# Patient Record
Sex: Female | Born: 1945 | Race: White | Hispanic: No | Marital: Married | Smoking: Never smoker
Health system: Southern US, Community
[De-identification: ages and names within clinical notes are randomized; demographics above are authoritative.]

## PROBLEM LIST (undated history)

## (undated) DIAGNOSIS — Z7982 Long term (current) use of aspirin: Secondary | ICD-10-CM

## (undated) DIAGNOSIS — N183 Chronic kidney disease, stage 3 unspecified: Secondary | ICD-10-CM

## (undated) DIAGNOSIS — I6523 Occlusion and stenosis of bilateral carotid arteries: Secondary | ICD-10-CM

## (undated) DIAGNOSIS — E039 Hypothyroidism, unspecified: Secondary | ICD-10-CM

## (undated) DIAGNOSIS — I4510 Unspecified right bundle-branch block: Secondary | ICD-10-CM

## (undated) DIAGNOSIS — M51379 Other intervertebral disc degeneration, lumbosacral region without mention of lumbar back pain or lower extremity pain: Secondary | ICD-10-CM

## (undated) DIAGNOSIS — M1711 Unilateral primary osteoarthritis, right knee: Secondary | ICD-10-CM

## (undated) DIAGNOSIS — N189 Chronic kidney disease, unspecified: Secondary | ICD-10-CM

## (undated) DIAGNOSIS — E119 Type 2 diabetes mellitus without complications: Secondary | ICD-10-CM

## (undated) DIAGNOSIS — I10 Essential (primary) hypertension: Secondary | ICD-10-CM

## (undated) DIAGNOSIS — I350 Nonrheumatic aortic (valve) stenosis: Secondary | ICD-10-CM

## (undated) DIAGNOSIS — K219 Gastro-esophageal reflux disease without esophagitis: Secondary | ICD-10-CM

## (undated) DIAGNOSIS — I5189 Other ill-defined heart diseases: Secondary | ICD-10-CM

## (undated) DIAGNOSIS — I2510 Atherosclerotic heart disease of native coronary artery without angina pectoris: Secondary | ICD-10-CM

## (undated) DIAGNOSIS — E7800 Pure hypercholesterolemia, unspecified: Secondary | ICD-10-CM

## (undated) DIAGNOSIS — M1990 Unspecified osteoarthritis, unspecified site: Secondary | ICD-10-CM

## (undated) HISTORY — PX: CATARACT EXTRACTION: SUR2

## (undated) HISTORY — PX: KNEE SURGERY: SHX244

## (undated) HISTORY — PX: ABDOMINAL HYSTERECTOMY: SHX81

---

## 2008-09-03 ENCOUNTER — Ambulatory Visit: Payer: Self-pay | Admitting: Family Medicine

## 2008-10-01 ENCOUNTER — Ambulatory Visit: Payer: Self-pay | Admitting: Family Medicine

## 2008-11-17 ENCOUNTER — Ambulatory Visit: Payer: Self-pay | Admitting: Family Medicine

## 2009-11-23 ENCOUNTER — Ambulatory Visit: Payer: Self-pay | Admitting: Family Medicine

## 2010-09-17 DIAGNOSIS — I10 Essential (primary) hypertension: Secondary | ICD-10-CM | POA: Insufficient documentation

## 2010-09-17 DIAGNOSIS — R011 Cardiac murmur, unspecified: Secondary | ICD-10-CM

## 2010-09-17 DIAGNOSIS — E039 Hypothyroidism, unspecified: Secondary | ICD-10-CM | POA: Insufficient documentation

## 2010-09-17 DIAGNOSIS — E7800 Pure hypercholesterolemia, unspecified: Secondary | ICD-10-CM | POA: Insufficient documentation

## 2010-09-17 HISTORY — DX: Cardiac murmur, unspecified: R01.1

## 2010-12-01 ENCOUNTER — Ambulatory Visit: Payer: Self-pay | Admitting: Family Medicine

## 2011-03-16 DIAGNOSIS — M1711 Unilateral primary osteoarthritis, right knee: Secondary | ICD-10-CM | POA: Insufficient documentation

## 2011-04-11 ENCOUNTER — Ambulatory Visit: Payer: Self-pay | Admitting: Sports Medicine

## 2011-04-20 ENCOUNTER — Ambulatory Visit: Payer: Self-pay | Admitting: Anesthesiology

## 2011-04-21 ENCOUNTER — Ambulatory Visit: Payer: Self-pay | Admitting: Orthopedic Surgery

## 2011-12-06 ENCOUNTER — Ambulatory Visit: Payer: Self-pay | Admitting: Family Medicine

## 2012-12-06 ENCOUNTER — Ambulatory Visit: Payer: Self-pay | Admitting: Family Medicine

## 2013-01-31 DIAGNOSIS — Z9841 Cataract extraction status, right eye: Secondary | ICD-10-CM

## 2013-01-31 HISTORY — DX: Cataract extraction status, right eye: Z98.41

## 2013-10-11 DIAGNOSIS — I6523 Occlusion and stenosis of bilateral carotid arteries: Secondary | ICD-10-CM | POA: Insufficient documentation

## 2013-12-17 ENCOUNTER — Ambulatory Visit: Payer: Self-pay | Admitting: Family Medicine

## 2014-05-25 NOTE — Op Note (Signed)
PATIENT NAME:  Julie Torres, Julie Torres A MR#:  [id number] DATE OF BIRTH:  07-Nov-1945  DATE OF PROCEDURE:  04/21/2011  PREOPERATIVE DIAGNOSIS: Medial meniscus tear and osteoarthritis, right knee.   POSTOPERATIVE DIAGNOSIS: Medial meniscus tear and osteoarthritis, right knee with loose body.   PROCEDURES:  1. Right knee arthroscopy.  2. Partial medial meniscectomy.  3. Removal of loose body.   SURGEON: Laurene Footman, MD  ANESTHESIA: General.    DESCRIPTION OF PROCEDURE: Patient brought to the Operating Room and after adequate anesthesia was obtained, the right leg was prepped and draped in the usual sterile fashion with a tourniquet and arthroscopic legholder applied. After appropriate patient identification and timeout procedures were completed an inferolateral portal was made and the arthroscope was introduced. Initial inspection revealed significant patellofemoral degenerative change but without exposed bone. Patella tracked well. There was a little bit of a plica shelf superiorly in the suprapatellar pouch which was divided but not really resected and it was an incidental finding. Coming around medially, an inferomedial portal was made and on probing the medial meniscus was found to have a very large tear posteriorly with essentially old buckethandle tear separated in the midportion with the posterior flap towards the notch and more medially. There was extensive osteoarthritis present within the medial compartment with fissuring and some extensive partial thickness loss throughout and this is present through most of the medial compartment both tibia and femur with really no normal articular cartilage. The notch was examined and there was a loose body present within it, approximately a centimeter in diameter. This was removed with use of a grabber and picture taken at the close of the case. Going to the lateral compartment, the lateral compartment was essentially normal with intact meniscus and articular  cartilage. The gutters were checked and there were no loose bodies. After addressing the meniscal pathology in the medial side with the arthroscopic shaver and ArthroCare wand back to a stable margin the knee was thoroughly irrigated and again pre- and postprocedure pictures obtained. The knee was infiltrated with 20 mL 0.5% Sensorcaine with epinephrine. Xeroform, 4 x 4's, Webril, and Ace wrap applied. Patient sent to recovery in stable condition.   ESTIMATED BLOOD LOSS: Minimal.   COMPLICATIONS: None.   SPECIMENS: None.  ____________________________ Laurene Footman, MD mjm:cms D: 04/21/2011 19:29:37 ET T: 04/22/2011 08:22:52 ET JOB#: [id number]  cc: Laurene Footman, MD, <Dictator> Laurene Footman MD ELECTRONICALLY SIGNED 04/22/2011 16:54

## 2014-10-28 ENCOUNTER — Other Ambulatory Visit: Payer: Self-pay | Admitting: Family Medicine

## 2014-10-28 DIAGNOSIS — Z1231 Encounter for screening mammogram for malignant neoplasm of breast: Secondary | ICD-10-CM

## 2014-12-23 ENCOUNTER — Ambulatory Visit: Payer: Self-pay

## 2014-12-24 ENCOUNTER — Ambulatory Visit
Admission: RE | Admit: 2014-12-24 | Discharge: 2014-12-24 | Disposition: A | Discharge status: Home / Self Care | Payer: Medicare Other | Source: Ambulatory Visit | Attending: Family Medicine | Admitting: Family Medicine

## 2014-12-24 DIAGNOSIS — Z1231 Encounter for screening mammogram for malignant neoplasm of breast: Secondary | ICD-10-CM | POA: Insufficient documentation

## 2015-06-19 ENCOUNTER — Encounter: Payer: Self-pay | Admitting: *Deleted

## 2015-06-22 ENCOUNTER — Ambulatory Visit: Payer: Medicare Other | Admitting: Anesthesiology

## 2015-06-22 ENCOUNTER — Encounter: Payer: Self-pay | Admitting: *Deleted

## 2015-06-22 ENCOUNTER — Ambulatory Visit
Admission: RE | Admit: 2015-06-22 | Discharge: 2015-06-22 | Disposition: A | Discharge status: Home / Self Care | Payer: Medicare Other | Source: Ambulatory Visit | Attending: Unknown Physician Specialty | Admitting: Unknown Physician Specialty

## 2015-06-22 ENCOUNTER — Encounter
Admission: RE | Disposition: A | Discharge status: Home / Self Care | Payer: Self-pay | Source: Ambulatory Visit | Attending: Unknown Physician Specialty

## 2015-06-22 DIAGNOSIS — E039 Hypothyroidism, unspecified: Secondary | ICD-10-CM | POA: Insufficient documentation

## 2015-06-22 DIAGNOSIS — Z7984 Long term (current) use of oral hypoglycemic drugs: Secondary | ICD-10-CM | POA: Diagnosis not present

## 2015-06-22 DIAGNOSIS — E119 Type 2 diabetes mellitus without complications: Secondary | ICD-10-CM | POA: Diagnosis not present

## 2015-06-22 DIAGNOSIS — D120 Benign neoplasm of cecum: Secondary | ICD-10-CM | POA: Diagnosis not present

## 2015-06-22 DIAGNOSIS — Z7982 Long term (current) use of aspirin: Secondary | ICD-10-CM | POA: Diagnosis not present

## 2015-06-22 DIAGNOSIS — K640 First degree hemorrhoids: Secondary | ICD-10-CM | POA: Diagnosis not present

## 2015-06-22 DIAGNOSIS — I10 Essential (primary) hypertension: Secondary | ICD-10-CM | POA: Insufficient documentation

## 2015-06-22 DIAGNOSIS — K5730 Diverticulosis of large intestine without perforation or abscess without bleeding: Secondary | ICD-10-CM | POA: Insufficient documentation

## 2015-06-22 DIAGNOSIS — Z79899 Other long term (current) drug therapy: Secondary | ICD-10-CM | POA: Insufficient documentation

## 2015-06-22 DIAGNOSIS — Z1211 Encounter for screening for malignant neoplasm of colon: Secondary | ICD-10-CM | POA: Diagnosis present

## 2015-06-22 HISTORY — DX: Essential (primary) hypertension: I10

## 2015-06-22 HISTORY — DX: Hypothyroidism, unspecified: E03.9

## 2015-06-22 HISTORY — PX: COLONOSCOPY WITH PROPOFOL: SHX5780

## 2015-06-22 HISTORY — DX: Type 2 diabetes mellitus without complications: E11.9

## 2015-06-22 LAB — GLUCOSE, CAPILLARY: Glucose-Capillary: 114 mg/dL — ABNORMAL HIGH (ref 65–99)

## 2015-06-22 SURGERY — COLONOSCOPY WITH PROPOFOL
Anesthesia: General

## 2015-06-22 MED ORDER — SODIUM CHLORIDE 0.9 % IV SOLN: INTRAVENOUS | Status: DC

## 2015-06-22 MED ORDER — PROPOFOL 500 MG/50ML IV EMUL
INTRAVENOUS | Status: DC | PRN
  Administered 2015-06-22: 120 ug/kg/min via INTRAVENOUS

## 2015-06-22 MED ORDER — SODIUM CHLORIDE 0.9 % IV SOLN
INTRAVENOUS | Status: DC
  Administered 2015-06-22: 08:00:00 via INTRAVENOUS

## 2015-06-22 MED ORDER — PROPOFOL 10 MG/ML IV BOLUS
INTRAVENOUS | Status: DC | PRN
  Administered 2015-06-22: 100 mg via INTRAVENOUS

## 2015-06-22 NOTE — Anesthesia Postprocedure Evaluation (Signed)
Anesthesia Post Note  Patient: Julie Torres  Procedure(s) Performed: Procedure(s) (LRB): COLONOSCOPY WITH PROPOFOL (N/A)  Patient location during evaluation: PACU Anesthesia Type: General Level of consciousness: awake and alert Pain management: pain level controlled Vital Signs Assessment: post-procedure vital signs reviewed and stable Respiratory status: spontaneous breathing, nonlabored ventilation, respiratory function stable and patient connected to nasal cannula oxygen Cardiovascular status: blood pressure returned to baseline and stable Postop Assessment: no signs of nausea or vomiting Anesthetic complications: no    Last Vitals:  Filed Vitals:   06/22/15 0851 06/22/15 0901  BP: 126/70 114/70  Pulse: 58 55  Temp:    Resp: 13 13    Last Pain: There were no vitals filed for this visit.               Molli Barrows

## 2015-06-22 NOTE — Anesthesia Preprocedure Evaluation (Signed)
Anesthesia Evaluation  Patient identified by MRN, date of birth, ID band Patient awake    Reviewed: Allergy & Precautions, H&P , NPO status , Patient's Chart, lab work & pertinent test results, reviewed documented beta blocker date and time   Airway Mallampati: II   Neck ROM: full    Dental  (+) Teeth Intact   Pulmonary neg pulmonary ROS,    Pulmonary exam normal        Cardiovascular hypertension, negative cardio ROS Normal cardiovascular exam     Neuro/Psych negative neurological ROS  negative psych ROS   GI/Hepatic negative GI ROS, Neg liver ROS,   Endo/Other  negative endocrine ROSdiabetesHypothyroidism   Renal/GU negative Renal ROS  negative genitourinary   Musculoskeletal   Abdominal   Peds  Hematology negative hematology ROS (+)   Anesthesia Other Findings Past Medical History:   Diabetes mellitus without complication ([place])                 Hypothyroidism                                               Hypertension                                               Past Surgical History:   ABDOMINAL HYSTERECTOMY                                        KNEE SURGERY                                    Right            BMI    Body Mass Index   33.62 kg/m 2     Reproductive/Obstetrics                             Anesthesia Physical Anesthesia Plan  ASA: II  Anesthesia Plan: General   Post-op Pain Management:    Induction:   Airway Management Planned:   Additional Equipment:   Intra-op Plan:   Post-operative Plan:   Informed Consent: I have reviewed the patients History and Physical, chart, labs and discussed the procedure including the risks, benefits and alternatives for the proposed anesthesia with the patient or authorized representative who has indicated his/her understanding and acceptance.   Dental Advisory Given  Plan Discussed with: CRNA  Anesthesia Plan Comments:          Anesthesia Quick Evaluation

## 2015-06-22 NOTE — Transfer of Care (Signed)
Immediate Anesthesia Transfer of Care Note  Patient: Julie Torres  Procedure(s) Performed: Procedure(s): COLONOSCOPY WITH PROPOFOL (N/A)  Patient Location: PACU and Endoscopy Unit  Anesthesia Type:General  Level of Consciousness: awake, alert  and oriented  Airway & Oxygen Therapy: Patient Spontanous Breathing and Patient connected to nasal cannula oxygen  Post-op Assessment: Report given to RN and Post -op Vital signs reviewed and stable  Post vital signs: Reviewed and stable  Last Vitals:  Filed Vitals:   06/22/15 0831 06/22/15 0832  BP:  98/59  Pulse: 61   Temp: 36.6 C   Resp: 15 18    Last Pain: There were no vitals filed for this visit.       Complications: No apparent anesthesia complications

## 2015-06-22 NOTE — H&P (Signed)
   Primary Care Physician:  Gayland Curry, MD Primary Gastroenterologist:  Dr. Vira Agar  Pre-Procedure History & Physical: HPI:  Julie Torres is a 70 y.o. female is here for an colonoscopy.   Past Medical History  Diagnosis Date  . Diabetes mellitus without complication ([place])   . Hypothyroidism   . Hypertension     Past Surgical History  Procedure Laterality Date  . Abdominal hysterectomy    . Knee surgery Right     Prior to Admission medications   Medication Sig Start Date End Date Taking? Authorizing Provider  aspirin 81 MG tablet Take 81 mg by mouth daily.   Yes Historical Provider, MD  enalapril (VASOTEC) 20 MG tablet Take 40 mg by mouth 2 (two) times daily.   Yes Historical Provider, MD  fenofibrate 54 MG tablet Take 54 mg by mouth daily.   Yes Historical Provider, MD  hydrochlorothiazide (HYDRODIURIL) 25 MG tablet Take 25 mg by mouth daily.   Yes Historical Provider, MD  levothyroxine (SYNTHROID, LEVOTHROID) 50 MCG tablet Take 50 mcg by mouth daily before breakfast.   Yes Historical Provider, MD  metFORMIN (GLUCOPHAGE) 500 MG tablet Take by mouth 2 (two) times daily with a meal.   Yes Historical Provider, MD  rosuvastatin (CRESTOR) 40 MG tablet Take 40 mg by mouth daily.   Yes Historical Provider, MD    Allergies as of 06/11/2015  . (Not on File)    History reviewed. No pertinent family history.  Social History   Social History  . Marital Status: Married    Spouse Name: N/A  . Number of Children: N/A  . Years of Education: N/A   Occupational History  . Not on file.   Social History Main Topics  . Smoking status: Never Smoker   . Smokeless tobacco: Never Used  . Alcohol Use: No  . Drug Use: No  . Sexual Activity: Not on file   Other Topics Concern  . Not on file   Social History Narrative    Review of Systems: See HPI, otherwise negative ROS  Physical Exam: BP 147/74 mmHg  Pulse 83  Temp(Src) 97.4 F (36.3 C) (Tympanic)  Resp 18  Ht  5\' 4"  (1.626 m)  Wt 88.905 kg (196 lb)  BMI 33.63 kg/m2  SpO2 100% General:   Alert,  pleasant and cooperative in NAD Head:  Normocephalic and atraumatic. Neck:  Supple; no masses or thyromegaly. Lungs:  Clear throughout to auscultation.    Heart:  Regular rate and rhythm. Abdomen:  Soft, nontender and nondistended. Normal bowel sounds, without guarding, and without rebound.   Neurologic:  Alert and  oriented x4;  grossly normal neurologically.  Impression/Plan: Julie Torres is here for an colonoscopy to be performed for screening  Risks, benefits, limitations, and alternatives regarding  colonoscopy have been reviewed with the patient.  Questions have been answered.  All parties agreeable.   Gaylyn Cheers, MD  06/22/2015, 8:05 AM

## 2015-06-22 NOTE — Op Note (Signed)
[facility] Patient Name: Julie Torres Procedure Date: 06/22/2015 8:05 AM MRN: [id number] Account #: [id number] Date of Birth: 1945/10/27 Admit Type: Outpatient Age: 70 Room: [facility] ENDO ROOM 1 Gender: Female Note Status: Finalized Procedure:            Colonoscopy Indications:          Screening for colorectal malignant neoplasm Providers:            Manya Silvas, MD Referring MD:         Gayland Curry, MD (Referring MD) Medicines:            Propofol per Anesthesia Complications:        No immediate complications. Procedure:            Pre-Anesthesia Assessment:                       - After reviewing the risks and benefits, the patient                        was deemed in satisfactory condition to undergo the                        procedure.                       After obtaining informed consent, the colonoscope was                        passed under direct vision. Throughout the procedure,                        the patient's blood pressure, pulse, and oxygen                        saturations were monitored continuously. The                        Colonoscope was introduced through the anus and                        advanced to the the cecum, identified by appendiceal                        orifice and ileocecal valve. The colonoscopy was                        performed without difficulty. The patient tolerated the                        procedure well. The quality of the bowel preparation                        was excellent. Findings:      A diminutive polyp was found in the cecum. The polyp was sessile. The       polyp was removed with a jumbo cold forceps. Resection and retrieval       were complete.      Many small-mouthed diverticula were found in the sigmoid colon.      Internal hemorrhoids were found during endoscopy. The hemorrhoids were       small and Grade I (internal  hemorrhoids that do not prolapse).      The exam  was otherwise without abnormality. Impression:           - One diminutive polyp in the cecum, removed with a                        jumbo cold forceps. Resected and retrieved.                       - Diverticulosis in the sigmoid colon.                       - Internal hemorrhoids.                       - The examination was otherwise normal. Recommendation:       - Await pathology results. Manya Silvas, MD 06/22/2015 8:29:24 AM This report has been signed electronically. Number of Addenda: 0 Note Initiated On: 06/22/2015 8:05 AM Scope Withdrawal Time: 0 hours 7 minutes 43 seconds  Total Procedure Duration: 0 hours 15 minutes 41 seconds       [facility]

## 2015-06-23 ENCOUNTER — Encounter: Payer: Self-pay | Admitting: Unknown Physician Specialty

## 2015-06-23 LAB — SURGICAL PATHOLOGY

## 2015-11-16 ENCOUNTER — Other Ambulatory Visit: Payer: Self-pay | Admitting: Family Medicine

## 2015-11-16 DIAGNOSIS — Z1231 Encounter for screening mammogram for malignant neoplasm of breast: Secondary | ICD-10-CM

## 2015-12-28 ENCOUNTER — Ambulatory Visit
Admission: RE | Admit: 2015-12-28 | Discharge: 2015-12-28 | Disposition: A | Discharge status: Home / Self Care | Payer: Medicare Other | Source: Ambulatory Visit | Attending: Family Medicine | Admitting: Family Medicine

## 2015-12-28 DIAGNOSIS — Z1231 Encounter for screening mammogram for malignant neoplasm of breast: Secondary | ICD-10-CM | POA: Diagnosis present

## 2016-12-06 ENCOUNTER — Other Ambulatory Visit: Payer: Self-pay | Admitting: Family Medicine

## 2016-12-06 DIAGNOSIS — Z1231 Encounter for screening mammogram for malignant neoplasm of breast: Secondary | ICD-10-CM

## 2016-12-26 DIAGNOSIS — I350 Nonrheumatic aortic (valve) stenosis: Secondary | ICD-10-CM | POA: Insufficient documentation

## 2016-12-29 ENCOUNTER — Ambulatory Visit
Admission: RE | Admit: 2016-12-29 | Discharge: 2016-12-29 | Disposition: A | Discharge status: Home / Self Care | Payer: Medicare Other | Source: Ambulatory Visit | Attending: Family Medicine | Admitting: Family Medicine

## 2016-12-29 DIAGNOSIS — Z1231 Encounter for screening mammogram for malignant neoplasm of breast: Secondary | ICD-10-CM | POA: Diagnosis present

## 2017-09-28 ENCOUNTER — Other Ambulatory Visit: Payer: Self-pay | Admitting: Family Medicine

## 2017-09-28 DIAGNOSIS — E2839 Other primary ovarian failure: Secondary | ICD-10-CM

## 2017-11-08 ENCOUNTER — Other Ambulatory Visit: Payer: Self-pay | Admitting: Family Medicine

## 2017-11-08 DIAGNOSIS — Z1231 Encounter for screening mammogram for malignant neoplasm of breast: Secondary | ICD-10-CM

## 2018-01-01 ENCOUNTER — Ambulatory Visit
Admission: RE | Admit: 2018-01-01 | Discharge: 2018-01-01 | Disposition: A | Discharge status: Home / Self Care | Payer: Medicare Other | Source: Ambulatory Visit | Attending: Family Medicine | Admitting: Family Medicine

## 2018-01-01 ENCOUNTER — Encounter (INDEPENDENT_AMBULATORY_CARE_PROVIDER_SITE_OTHER): Payer: Self-pay

## 2018-01-01 DIAGNOSIS — E2839 Other primary ovarian failure: Secondary | ICD-10-CM | POA: Diagnosis not present

## 2018-01-01 DIAGNOSIS — Z1231 Encounter for screening mammogram for malignant neoplasm of breast: Secondary | ICD-10-CM | POA: Insufficient documentation

## 2018-01-02 DIAGNOSIS — M85832 Other specified disorders of bone density and structure, left forearm: Secondary | ICD-10-CM | POA: Insufficient documentation

## 2018-05-07 ENCOUNTER — Ambulatory Visit
Admission: EM | Admit: 2018-05-07 | Discharge: 2018-05-07 | Disposition: A | Discharge status: Home / Self Care | Payer: Medicare Other | Attending: Family Medicine | Admitting: Family Medicine

## 2018-05-07 ENCOUNTER — Other Ambulatory Visit: Payer: Self-pay

## 2018-05-07 ENCOUNTER — Ambulatory Visit: Payer: Medicare Other

## 2018-05-07 DIAGNOSIS — Z7189 Other specified counseling: Secondary | ICD-10-CM | POA: Diagnosis present

## 2018-05-07 DIAGNOSIS — F418 Other specified anxiety disorders: Secondary | ICD-10-CM

## 2018-05-07 DIAGNOSIS — R0602 Shortness of breath: Secondary | ICD-10-CM | POA: Insufficient documentation

## 2018-05-07 NOTE — Discharge Instructions (Signed)
Continue your medication as prescribed by your doctor.  As discussed also take over-the-counter plain Zyrtec or Claritin daily.  Try to have adequate sleep.  Avoid stressors.  Follow-up closely with your primary care.  Call tomorrow.  As discussed for any worsening complaints proceed directly to the emergency room.

## 2018-05-07 NOTE — ED Provider Notes (Signed)
[facility] ____________________________________________  Time seen: Approximately 7:18 PM  I have reviewed the triage vital signs and the nursing notes.   HISTORY  Chief Complaint Shortness of Breath  HPI Julie Torres is a 73 y.o. female past medical history of diabetes, hypertension presenting for evaluation of shortness of breath complaints.  Patient states that this is been present for the last 1 to 2 weeks.  States it is not constant, but it is intermittent.  Patient describes shortness of breath, is not a normal shortness of breath, but rather a sensation of not getting adequate air with breath intermittently.  Patient further describes the sensation as "when you need to yawn to get a bigger breath, it is like that ".  States she has been able to continue walking and her normal activity levels without shortness of breath during activity.  Also states that laying down sometimes helps.  States unsure if related to pollen or anxiety. States she is anxious about Covid-19. Denies any accompanying cough, chest pain, chest pressure, hemoptysis, sore throat, fever, weakness, extremity edema, recent immobilization. States her son and husband have been at home and they have had congestion with allergies but denies any other potential sick contacts.  No recent travel.  Patient further states that she did a telephone encounter with her primary office this past Friday and was given Ativan to take 0.25 mg twice a day as needed.  States she has not taken her Ativan today, but did take it Saturday and Sunday, and states that it did help.  States the Ativan does seem to help relieve the shortness of breath sensation.  Denies any pain at this time.  Denies current shortness of breath sensation.  Denies other accompanying symptoms.  Denies other aggravating or alleviating factors.  Denies recent sickness.  Gayland Curry, MD: PCP  Past Medical History:  Diagnosis Date  . Diabetes  mellitus without complication ([place])   . Hypertension   . Hypothyroidism     There are no active problems to display for this patient.   Past Surgical History:  Procedure Laterality Date  . ABDOMINAL HYSTERECTOMY    . COLONOSCOPY WITH PROPOFOL N/A 06/22/2015   Procedure: COLONOSCOPY WITH PROPOFOL;  Surgeon: Manya Silvas, MD;  Location: [facility] ENDOSCOPY;  Service: Endoscopy;  Laterality: N/A;  . KNEE SURGERY Right      No current facility-administered medications for this encounter.   Current Outpatient Medications:  .  aspirin 81 MG tablet, Take 81 mg by mouth daily., Disp: , Rfl:  .  enalapril (VASOTEC) 20 MG tablet, Take 40 mg by mouth 2 (two) times daily., Disp: , Rfl:  .  fenofibrate 54 MG tablet, Take 54 mg by mouth daily., Disp: , Rfl:  .  hydrochlorothiazide (HYDRODIURIL) 25 MG tablet, Take 25 mg by mouth daily., Disp: , Rfl:  .  levothyroxine (SYNTHROID, LEVOTHROID) 50 MCG tablet, Take 50 mcg by mouth daily before breakfast., Disp: , Rfl:  .  metFORMIN (GLUCOPHAGE) 500 MG tablet, Take by mouth 2 (two) times daily with a meal., Disp: , Rfl:  .  rosuvastatin (CRESTOR) 40 MG tablet, Take 40 mg by mouth daily., Disp: , Rfl:   Allergies Patient has no known allergies.  Family History  Problem Relation Age of Onset  . Breast cancer Neg Hx     Social History Social History   Tobacco Use  . Smoking status: Never Smoker  . Smokeless tobacco: Never Used  Substance Use Topics  . Alcohol use:  No  . Drug use: No    Review of Systems Constitutional: No fever/chills/body aches. ENT: No sore throat. Cardiovascular: Denies chest pain. Respiratory: Positive shortness of breath. Gastrointestinal: No abdominal pain Musculoskeletal: Negative for back pain. Negative extremity pain or edema.  Skin: Negative for rash. Neurological: Negative for headaches, focal weakness or numbness.    ____________________________________________   PHYSICAL EXAM:  VITAL SIGNS: ED  Triage Vitals  Enc Vitals Group     BP 05/07/18 1752 (!) 154/93     Pulse Rate 05/07/18 1752 81     Resp 05/07/18 1752 16     Temp 05/07/18 1752 98.1 F (36.7 C)     Temp Source 05/07/18 1752 Oral     SpO2 05/07/18 1752 100 %     Weight 05/07/18 1750 185 lb (83.9 kg)     Height 05/07/18 1750 5\' 5"  (1.651 m)     Head Circumference --      Peak Flow --      Pain Score 05/07/18 1750 0     Pain Loc --      Pain Edu? --      Excl. in [place]? --     Constitutional: Alert and oriented. Well appearing and in no acute distress. ENT      Head: Normocephalic and atraumatic.      Nose: No congestion.       Mouth/Throat: Mucous membranes are moist.Oropharynx non-erythematous. Neck: No stridor. Supple without meningismus.  Hematological/Lymphatic/Immunilogical: No cervical lymphadenopathy. Cardiovascular: Normal rate, regular rhythm. Grossly normal heart sounds. Good peripheral circulation. Respiratory: Normal respiratory effort without tachypnea nor retractions. Breath sounds are clear and equal bilaterally. No wheezes, rales, rhonchi. Musculoskeletal: Steady gait.  No lower extremity edema noted bilaterally.  Bilateral pedal pulses equal and easily palpated. Neurologic:  Normal speech and language. Speech is normal. No gait instability.  Skin:  Skin is warm, dry and intact. No rash noted. Psychiatric: Mood and affect are normal. Speech and behavior are normal. Patient exhibits appropriate insight and judgment.    ___________________________________________   LABS (all labs ordered are listed, but only abnormal results are displayed)  Labs Reviewed - No data to display ____________________________________________  EKG  ED ECG REPORT I, Marylene Land, the attending provider, personally viewed and interpreted this ECG.   Date: 05/07/2018  EKG Time: 1812  Rate: 74  Rhythm: normal sinus rhythm  Axis: normal  Intervals: Incomplete right bundle branch block  ST&T Change: none Similar  to previous EKG on April 20, 2011.  RADIOLOGY  Dg Chest 2 View  Result Date: 05/07/2018 CLINICAL DATA:  Short of breath EXAM: CHEST - 2 VIEW COMPARISON:  None. FINDINGS: The heart size and mediastinal contours are within normal limits. Both lungs are clear. The visualized skeletal structures are unremarkable. IMPRESSION: No active cardiopulmonary disease. Electronically Signed   By: Franchot Gallo M.D.   On: 05/07/2018 18:40   ____________________________________________   PROCEDURES Procedures     INITIAL IMPRESSION / ASSESSMENT AND PLAN / ED COURSE  Pertinent labs & imaging results that were available during my care of the patient were reviewed by me and considered in my medical decision making (see chart for details).  Well-appearing patient.  No acute distress.  Intermittent shortness of breath complaints described as above, without other accompanying symptoms.  EKG similar to previous without acute changes noted.  Chest x-ray unremarkable.  Exam well-appearing.  Patient states that she feels that anxiety may be a piece of this and her recent prescription  of Ativan helped.  Suspect anxiety is affecting patient.  Recommend to continue current prescription as needed from primary doctor.  Over-the-counter Claritin or Zyrtec plain as needed.  Close monitoring.  Close following up with her primary care tomorrow.  Discussed proceed directly to the emergency room for any increased shortness of breath, chest pain or worsening concerns.  Patient verbalized understanding and agreed to plan.  Educational information from [employer] health as well as [facility] about COVID-19 given.  ____________________________________________   FINAL CLINICAL IMPRESSION(S) / ED DIAGNOSES  Final diagnoses:  SOB (shortness of breath)  Situational anxiety  Advice Given About Covid-19 Virus Infection     ED Discharge Orders    None       Note: This dictation was prepared with Dragon dictation along with smaller  phrase technology. Any transcriptional errors that result from this process are unintentional.         Marylene Land, NP 05/07/18 1950

## 2018-05-07 NOTE — ED Triage Notes (Signed)
Patient complains of shortness of breath that started around 1 week ago. Patient states that she feels like she feels like she cannot force air in her lungs. Patient states that she doesn't have any pain. Denies cough, or fever. Patient states that she has been out in a lot of pollen.

## 2018-11-28 ENCOUNTER — Other Ambulatory Visit: Payer: Self-pay | Admitting: Family Medicine

## 2018-11-28 DIAGNOSIS — Z1231 Encounter for screening mammogram for malignant neoplasm of breast: Secondary | ICD-10-CM

## 2019-01-03 ENCOUNTER — Other Ambulatory Visit: Payer: Self-pay

## 2019-01-03 ENCOUNTER — Encounter (INDEPENDENT_AMBULATORY_CARE_PROVIDER_SITE_OTHER): Payer: Self-pay

## 2019-01-03 ENCOUNTER — Ambulatory Visit
Admission: RE | Admit: 2019-01-03 | Discharge: 2019-01-03 | Disposition: A | Discharge status: Home / Self Care | Payer: Medicare Other | Source: Ambulatory Visit | Attending: Family Medicine | Admitting: Family Medicine

## 2019-01-03 DIAGNOSIS — Z1231 Encounter for screening mammogram for malignant neoplasm of breast: Secondary | ICD-10-CM | POA: Diagnosis not present

## 2019-12-03 ENCOUNTER — Other Ambulatory Visit: Payer: Self-pay | Admitting: Family Medicine

## 2019-12-03 DIAGNOSIS — Z1231 Encounter for screening mammogram for malignant neoplasm of breast: Secondary | ICD-10-CM

## 2020-01-06 ENCOUNTER — Ambulatory Visit
Admission: RE | Admit: 2020-01-06 | Discharge: 2020-01-06 | Disposition: A | Discharge status: Home / Self Care | Payer: Medicare Other | Source: Ambulatory Visit | Attending: Family Medicine | Admitting: Family Medicine

## 2020-01-06 ENCOUNTER — Other Ambulatory Visit: Payer: Self-pay

## 2020-01-06 DIAGNOSIS — Z1231 Encounter for screening mammogram for malignant neoplasm of breast: Secondary | ICD-10-CM | POA: Diagnosis not present

## 2020-03-05 DIAGNOSIS — S42401A Unspecified fracture of lower end of right humerus, initial encounter for closed fracture: Secondary | ICD-10-CM | POA: Insufficient documentation

## 2020-03-05 DIAGNOSIS — S40011A Contusion of right shoulder, initial encounter: Secondary | ICD-10-CM | POA: Insufficient documentation

## 2020-03-05 DIAGNOSIS — M62011 Separation of muscle (nontraumatic), right shoulder: Secondary | ICD-10-CM | POA: Insufficient documentation

## 2020-08-07 DIAGNOSIS — E669 Obesity, unspecified: Secondary | ICD-10-CM | POA: Insufficient documentation

## 2020-09-18 ENCOUNTER — Encounter: Payer: Self-pay | Admitting: *Deleted

## 2020-09-21 ENCOUNTER — Ambulatory Visit: Payer: Medicare Other | Admitting: Certified Registered Nurse Anesthetist

## 2020-09-21 ENCOUNTER — Encounter: Payer: Self-pay | Admitting: *Deleted

## 2020-09-21 ENCOUNTER — Ambulatory Visit
Admission: RE | Admit: 2020-09-21 | Discharge: 2020-09-21 | Disposition: A | Discharge status: Home / Self Care | Payer: Medicare Other | Attending: Gastroenterology | Admitting: Gastroenterology

## 2020-09-21 ENCOUNTER — Encounter
Admission: RE | Disposition: A | Discharge status: Home / Self Care | Payer: Self-pay | Source: Home / Self Care | Attending: Gastroenterology

## 2020-09-21 DIAGNOSIS — I10 Essential (primary) hypertension: Secondary | ICD-10-CM | POA: Insufficient documentation

## 2020-09-21 DIAGNOSIS — Z86010 Personal history of colonic polyps: Secondary | ICD-10-CM | POA: Diagnosis not present

## 2020-09-21 DIAGNOSIS — K5730 Diverticulosis of large intestine without perforation or abscess without bleeding: Secondary | ICD-10-CM | POA: Diagnosis not present

## 2020-09-21 DIAGNOSIS — Z7984 Long term (current) use of oral hypoglycemic drugs: Secondary | ICD-10-CM | POA: Insufficient documentation

## 2020-09-21 DIAGNOSIS — Z7982 Long term (current) use of aspirin: Secondary | ICD-10-CM | POA: Insufficient documentation

## 2020-09-21 DIAGNOSIS — E119 Type 2 diabetes mellitus without complications: Secondary | ICD-10-CM | POA: Insufficient documentation

## 2020-09-21 DIAGNOSIS — Z1211 Encounter for screening for malignant neoplasm of colon: Secondary | ICD-10-CM | POA: Diagnosis not present

## 2020-09-21 HISTORY — PX: COLONOSCOPY WITH PROPOFOL: SHX5780

## 2020-09-21 HISTORY — DX: Gastro-esophageal reflux disease without esophagitis: K21.9

## 2020-09-21 HISTORY — DX: Unspecified osteoarthritis, unspecified site: M19.90

## 2020-09-21 HISTORY — DX: Pure hypercholesterolemia, unspecified: E78.00

## 2020-09-21 LAB — GLUCOSE, CAPILLARY: Glucose-Capillary: 105 mg/dL — ABNORMAL HIGH (ref 70–99)

## 2020-09-21 SURGERY — COLONOSCOPY WITH PROPOFOL
Anesthesia: General

## 2020-09-21 MED ORDER — GLYCOPYRROLATE 0.2 MG/ML IJ SOLN
INTRAMUSCULAR | Status: DC | PRN
  Administered 2020-09-21: .2 mg via INTRAVENOUS

## 2020-09-21 MED ORDER — SODIUM CHLORIDE 0.9 % IV SOLN: INTRAVENOUS | Status: DC

## 2020-09-21 MED ORDER — PROPOFOL 10 MG/ML IV BOLUS
INTRAVENOUS | Status: DC | PRN
  Administered 2020-09-21: 70 mg via INTRAVENOUS

## 2020-09-21 MED ORDER — PROPOFOL 500 MG/50ML IV EMUL
INTRAVENOUS | Status: DC | PRN
  Administered 2020-09-21: 150 ug/kg/min via INTRAVENOUS

## 2020-09-21 NOTE — Anesthesia Preprocedure Evaluation (Signed)
Anesthesia Evaluation  Patient identified by MRN, date of birth, ID band Patient awake    Reviewed: Allergy & Precautions, H&P , NPO status , Patient's Chart, lab work & pertinent test results, reviewed documented beta blocker date and time   History of Anesthesia Complications Negative for: history of anesthetic complications  Airway Mallampati: II   Neck ROM: full    Dental  (+) Teeth Intact   Pulmonary neg pulmonary ROS, neg sleep apnea, neg COPD, Patient abstained from smoking.Not current smoker,    Pulmonary exam normal breath sounds clear to auscultation       Cardiovascular Exercise Tolerance: Good METShypertension, (-) CAD and (-) Past MI Normal cardiovascular exam(-) dysrhythmias + Valvular Problems/Murmurs AS  Rhythm:Regular Rate:Normal  TTE 2022: INTERPRETATION  NORMAL LEFT VENTRICULAR SYSTOLIC FUNCTION  WITH MODERATE LVH  NORMAL RIGHT VENTRICULAR SYSTOLIC FUNCTION  MILD VALVULAR STENOSIS (See above)  AVA(VTI)=.74cm^2  MILD AR, TR, PR  TRIVIAL MR  MILD AS  EF >55%  Closest EF: >55% (Estimated)  LVH: MODERATE LVH  Aortic: MILD AR  AVS: MILD AS  Mitral: TRIVIAL MR  Tricuspid: MILD TR     Neuro/Psych negative neurological ROS  negative psych ROS   GI/Hepatic Neg liver ROS, GERD  ,  Endo/Other  diabetesHypothyroidism   Renal/GU negative Renal ROS  negative genitourinary   Musculoskeletal   Abdominal   Peds  Hematology negative hematology ROS (+)   Anesthesia Other Findings Past Medical History:   Diabetes mellitus without complication ([facility])                 Hypothyroidism                                               Hypertension                                               Past Surgical History:   ABDOMINAL HYSTERECTOMY                                        KNEE SURGERY                                    Right            BMI    Body Mass Index   33.62 kg/m 2      Reproductive/Obstetrics                             Anesthesia Physical  Anesthesia Plan  ASA: 2  Anesthesia Plan: General   Post-op Pain Management:    Induction: Intravenous  PONV Risk Score and Plan: 3 and Ondansetron, Propofol infusion and TIVA  Airway Management Planned: Nasal Cannula  Additional Equipment: None  Intra-op Plan:   Post-operative Plan:   Informed Consent: I have reviewed the patients History and Physical, chart, labs and discussed the procedure including the risks, benefits and alternatives for the proposed anesthesia with the patient or authorized representative who has indicated his/her understanding and  acceptance.     Dental Advisory Given  Plan Discussed with: CRNA  Anesthesia Plan Comments: (Discussed risks of anesthesia with patient, including possibility of difficulty with spontaneous ventilation under anesthesia necessitating airway intervention, PONV, and rare risks such as cardiac or respiratory or neurological events, and allergic reactions. Patient understands.)        Anesthesia Quick Evaluation

## 2020-09-21 NOTE — Anesthesia Postprocedure Evaluation (Signed)
Anesthesia Post Note  Patient: Julie Torres  Procedure(s) Performed: COLONOSCOPY WITH PROPOFOL  Patient location during evaluation: Endoscopy Anesthesia Type: General Level of consciousness: awake and alert Pain management: pain level controlled Vital Signs Assessment: post-procedure vital signs reviewed and stable Respiratory status: spontaneous breathing, nonlabored ventilation, respiratory function stable and patient connected to nasal cannula oxygen Cardiovascular status: blood pressure returned to baseline and stable Postop Assessment: no apparent nausea or vomiting Anesthetic complications: no   No notable events documented.   Last Vitals:  Vitals:   09/21/20 0934 09/21/20 0944  BP: (!) 112/56 (!) 124/56  Pulse: 66 64  Resp: 17 12  Temp:    SpO2: 97% 97%    Last Pain:  Vitals:   09/21/20 0944  TempSrc:   PainSc: 0-No pain                 Arita Miss

## 2020-09-21 NOTE — Op Note (Signed)
[facility] Gastroenterology Patient Name: Julie Torres Procedure Date: 09/21/2020 8:44 AM MRN: [id number] Account #: [id number] Date of Birth: 17-May-1945 Admit Type: Outpatient Age: 75 Room: [facility] ENDO ROOM 3 Gender: Female Note Status: Finalized Procedure:             Colonoscopy Indications:           Surveillance: Personal history of adenomatous polyps                         on last colonoscopy 5 years ago Providers:             Andrey Farmer MD, MD Referring MD:          Gayland Curry MD, MD (Referring MD) Medicines:             Monitored Anesthesia Care Complications:         No immediate complications. Procedure:             Pre-Anesthesia Assessment:                        - Prior to the procedure, a History and Physical was                         performed, and patient medications and allergies were                         reviewed. The patient is competent. The risks and                         benefits of the procedure and the sedation options and                         risks were discussed with the patient. All questions                         were answered and informed consent was obtained.                         Patient identification and proposed procedure were                         verified by the physician, the nurse, the anesthetist                         and the technician in the endoscopy suite. Mental                         Status Examination: alert and oriented. Airway                         Examination: normal oropharyngeal airway and neck                         mobility. Respiratory Examination: clear to                         auscultation. CV Examination: normal. Prophylactic  Antibiotics: The patient does not require prophylactic                         antibiotics. Prior Anticoagulants: The patient has                         taken no previous anticoagulant or antiplatelet                          agents. ASA Grade Assessment: II - A patient with mild                         systemic disease. After reviewing the risks and                         benefits, the patient was deemed in satisfactory                         condition to undergo the procedure. The anesthesia                         plan was to use monitored anesthesia care (MAC).                         Immediately prior to administration of medications,                         the patient was re-assessed for adequacy to receive                         sedatives. The heart rate, respiratory rate, oxygen                         saturations, blood pressure, adequacy of pulmonary                         ventilation, and response to care were monitored                         throughout the procedure. The physical status of the                         patient was re-assessed after the procedure.                        After obtaining informed consent, the colonoscope was                         passed under direct vision. Throughout the procedure,                         the patient's blood pressure, pulse, and oxygen                         saturations were monitored continuously. The                         Colonoscope was introduced through the anus and  advanced to the the cecum, identified by appendiceal                         orifice and ileocecal valve. The colonoscopy was                         performed without difficulty. The patient tolerated                         the procedure well. The quality of the bowel                         preparation was adequate to identify polyps. Findings:      The perianal and digital rectal examinations were normal.      Multiple small-mouthed diverticula were found in the sigmoid colon.      The exam was otherwise without abnormality on direct and retroflexion       views. Impression:            - Diverticulosis in the sigmoid colon.                         - The examination was otherwise normal on direct and                         retroflexion views.                        - No specimens collected. Recommendation:        - Discharge patient to home.                        - Resume previous diet.                        - Continue present medications.                        - Repeat colonoscopy is not recommended due to current                         age (75 years or older) for surveillance.                        - Return to referring physician as previously                         scheduled. Procedure Code(s):     --- Professional ---                        X9371, Colorectal cancer screening; colonoscopy on                         individual at high risk Diagnosis Code(s):     --- Professional ---                        Z86.010, Personal history of colonic polyps                        K57.30, Diverticulosis of  large intestine without                         perforation or abscess without bleeding CPT copyright 2019 American Medical Association. All rights reserved. The codes documented in this report are preliminary and upon coder review may  be revised to meet current compliance requirements. Andrey Farmer MD, MD 09/21/2020 9:13:48 AM Number of Addenda: 0 Note Initiated On: 09/21/2020 8:44 AM Scope Withdrawal Time: 0 hours 9 minutes 9 seconds  Total Procedure Duration: 0 hours 17 minutes 46 seconds  Estimated Blood Loss:  Estimated blood loss: none.      [facility]

## 2020-09-21 NOTE — Interval H&P Note (Signed)
History and Physical Interval Note:  09/21/2020 8:45 AM  Julie Torres  has presented today for surgery, with the diagnosis of history of adenamatous polyps of colon.  The various methods of treatment have been discussed with the patient and family. After consideration of risks, benefits and other options for treatment, the patient has consented to  Procedure(s): COLONOSCOPY WITH PROPOFOL (N/A) as a surgical intervention.  The patient's history has been reviewed, patient examined, no change in status, stable for surgery.  I have reviewed the patient's chart and labs.  Questions were answered to the patient's satisfaction.     Lesly Rubenstein  Ok to proceed with colonoscopy

## 2020-09-21 NOTE — Transfer of Care (Signed)
Immediate Anesthesia Transfer of Care Note  Patient: Julie Torres  Procedure(s) Performed: COLONOSCOPY WITH PROPOFOL  Patient Location: PACU  Anesthesia Type:General  Level of Consciousness: awake, alert  and oriented  Airway & Oxygen Therapy: Patient Spontanous Breathing and Patient connected to nasal cannula oxygen  Post-op Assessment: Report given to RN and Post -op Vital signs reviewed and stable  Post vital signs: Reviewed and stable  Last Vitals:  Vitals Value Taken Time  BP    Temp    Pulse    Resp    SpO2      Last Pain:  Vitals:   09/21/20 0759  TempSrc: Temporal         Complications: No notable events documented.

## 2020-09-21 NOTE — H&P (Signed)
Outpatient short stay form Pre-procedure 09/21/2020  Julie Rubenstein, MD  Primary Physician: Gayland Curry, MD  Reason for visit:  Surveillance colonoscopy  History of present illness:   75 y/o lady with history of hypertension and small TA on colonoscopy done 5 years ago here for surveillance colonoscopy. No blood thinners. No family history of GI malignancies. History of hysterectomy.    Current Facility-Administered Medications:    0.9 %  sodium chloride infusion, , Intravenous, Continuous, Tanay Misuraca, Hilton Cork, MD, Last Rate: 20 mL/hr at 09/21/20 [id number], Continued from Pre-op at 09/21/20 [id number]  Medications Prior to Admission  Medication Sig Dispense Refill Last Dose   amLODipine (NORVASC) 10 MG tablet Take 10 mg by mouth daily.   09/20/2020   aspirin 81 MG tablet Take 81 mg by mouth daily.   Past Week   enalapril (VASOTEC) 20 MG tablet Take 40 mg by mouth 2 (two) times daily.   09/20/2020   ezetimibe (ZETIA) 10 MG tablet Take 10 mg by mouth daily.      hydrochlorothiazide (HYDRODIURIL) 25 MG tablet Take 25 mg by mouth daily.   09/20/2020   metFORMIN (GLUCOPHAGE) 500 MG tablet Take by mouth 2 (two) times daily with a meal.   09/20/2020   fenofibrate 54 MG tablet Take 54 mg by mouth daily.      levothyroxine (SYNTHROID, LEVOTHROID) 50 MCG tablet Take 50 mcg by mouth daily before breakfast.      rosuvastatin (CRESTOR) 40 MG tablet Take 40 mg by mouth daily.        No Known Allergies   Past Medical History:  Diagnosis Date   Arthritis    Diabetes mellitus without complication (HCC)    GERD (gastroesophageal reflux disease)    Heart murmur 09/17/2010   unspecified   Hypertension    Hypothyroidism    Pure hypercholesterolemia     Review of systems:  Otherwise negative.    Physical Exam  Gen: Alert, oriented. Appears stated age.  HEENT: PERRLA. Lungs: No respiratory distress CV: RRR Abd: soft, benign, no masses Ext: No edema    Planned procedures: Proceed with  colonoscopy. The patient understands the nature of the planned procedure, indications, risks, alternatives and potential complications including but not limited to bleeding, infection, perforation, damage to internal organs and possible oversedation/side effects from anesthesia. The patient agrees and gives consent to proceed.  Please refer to procedure notes for findings, recommendations and patient disposition/instructions.     Julie Rubenstein, MD [facility]

## 2020-09-22 ENCOUNTER — Encounter: Payer: Self-pay | Admitting: Gastroenterology

## 2020-12-04 ENCOUNTER — Other Ambulatory Visit: Payer: Self-pay | Admitting: Family Medicine

## 2020-12-04 DIAGNOSIS — Z1231 Encounter for screening mammogram for malignant neoplasm of breast: Secondary | ICD-10-CM

## 2021-01-12 ENCOUNTER — Other Ambulatory Visit: Payer: Self-pay

## 2021-01-12 ENCOUNTER — Ambulatory Visit
Admission: RE | Admit: 2021-01-12 | Discharge: 2021-01-12 | Disposition: A | Discharge status: Home / Self Care | Payer: Medicare Other | Source: Ambulatory Visit | Attending: Family Medicine | Admitting: Family Medicine

## 2021-01-12 DIAGNOSIS — Z1231 Encounter for screening mammogram for malignant neoplasm of breast: Secondary | ICD-10-CM | POA: Insufficient documentation

## 2021-01-30 ENCOUNTER — Encounter: Payer: Self-pay | Admitting: Emergency Medicine

## 2021-01-30 ENCOUNTER — Other Ambulatory Visit: Payer: Self-pay

## 2021-01-30 ENCOUNTER — Ambulatory Visit
Admission: EM | Admit: 2021-01-30 | Discharge: 2021-01-30 | Disposition: A | Discharge status: Home / Self Care | Payer: Medicare Other | Attending: Emergency Medicine | Admitting: Emergency Medicine

## 2021-01-30 DIAGNOSIS — J0100 Acute maxillary sinusitis, unspecified: Secondary | ICD-10-CM

## 2021-01-30 MED ORDER — FLUTICASONE PROPIONATE 50 MCG/ACT NA SUSP: 2.0000 | Freq: Every day | NASAL | 0 refills | Status: DC

## 2021-01-30 MED ORDER — AMOXICILLIN-POT CLAVULANATE 875-125 MG PO TABS: 1.0000 | ORAL_TABLET | Freq: Two times a day (BID) | ORAL | 0 refills | Status: DC

## 2021-01-30 MED ORDER — BENZONATATE 200 MG PO CAPS: 200.0000 mg | ORAL_CAPSULE | Freq: Three times a day (TID) | ORAL | 0 refills | Status: DC | PRN

## 2021-01-30 NOTE — ED Triage Notes (Signed)
Patient c/o cough, congestion, and nasal congestion that started 6 days ago.  Patient denies fevers.  Patient states that she took a home covid test on Tuesday and was negative.

## 2021-01-30 NOTE — Discharge Instructions (Addendum)
finish the Augmentin even if you feel better, saline nasal irrigation with a NeilMed sinus rinse and distilled water as often as you want, Flonase, continue Mucinex, Tessalon as needed for the cough, hot water/honey/lemon tea as often as you want.

## 2021-01-30 NOTE — ED Provider Notes (Signed)
HPI  SUBJECTIVE:  Julie Torres is a 75 y.o. female who presents with 6 days of nasal congestion, headache, maxillary sinus pain and pressure, yellowish-green rhinorrhea, postnasal drip, sneezing and coughing.  She states that she is unable to sleep at night secondary to the cough.  No fevers, body aches, facial swelling, upper dental pain, wheezing, shortness of breath, nausea, vomiting, diarrhea, abdominal pain.  She had a negative home COVID test 2 days ago.  She got 3 doses of the COVID-vaccine and the flu vaccine.  No antibiotics in the past month.  No Antipyretic in the past 6 hours.  She has tried Mucinex, Robitussin and sleeping with her head elevated without improvement in her symptoms.  Symptoms worse at night.  She has a past medical of diabetes, hypertension, Heart murmur.  No history of chronic kidney disease.  [id number], Julie Hurry, MD   Past Medical History:  Diagnosis Date   Arthritis    Diabetes mellitus without complication ([place])    GERD (gastroesophageal reflux disease)    Heart murmur 09/17/2010   unspecified   Hypertension    Hypothyroidism    Pure hypercholesterolemia     Past Surgical History:  Procedure Laterality Date   ABDOMINAL HYSTERECTOMY     CATARACT EXTRACTION     COLONOSCOPY WITH PROPOFOL N/A 06/22/2015   Procedure: COLONOSCOPY WITH PROPOFOL;  Surgeon: Manya Silvas, MD;  Location: [place];  Service: Endoscopy;  Laterality: N/A;   COLONOSCOPY WITH PROPOFOL N/A 09/21/2020   Procedure: COLONOSCOPY WITH PROPOFOL;  Surgeon: Lesly Rubenstein, MD;  Location: [facility] ENDOSCOPY;  Service: Endoscopy;  Laterality: N/A;   KNEE SURGERY Right     Family History  Problem Relation Age of Onset   Breast cancer Neg Hx     Social History   Tobacco Use   Smoking status: Never   Smokeless tobacco: Never  Vaping Use   Vaping Use: Never used  Substance Use Topics   Alcohol use: No   Drug use: No    No current facility-administered medications  for this encounter.  Current Outpatient Medications:    amLODipine (NORVASC) 10 MG tablet, Take 10 mg by mouth daily., Disp: , Rfl:    amoxicillin-clavulanate (AUGMENTIN) 875-125 MG tablet, Take 1 tablet by mouth 2 (two) times daily. X 7 days, Disp: 14 tablet, Rfl: 0   aspirin 81 MG tablet, Take 81 mg by mouth daily., Disp: , Rfl:    benzonatate (TESSALON) 200 MG capsule, Take 1 capsule (200 mg total) by mouth 3 (three) times daily as needed for cough., Disp: 30 capsule, Rfl: 0   enalapril (VASOTEC) 20 MG tablet, Take 40 mg by mouth 2 (two) times daily., Disp: , Rfl:    ezetimibe (ZETIA) 10 MG tablet, Take 10 mg by mouth daily., Disp: , Rfl:    fenofibrate 54 MG tablet, Take 54 mg by mouth daily., Disp: , Rfl:    fluticasone (FLONASE) 50 MCG/ACT nasal spray, Place 2 sprays into both nostrils daily., Disp: 16 g, Rfl: 0   hydrochlorothiazide (HYDRODIURIL) 25 MG tablet, Take 25 mg by mouth daily., Disp: , Rfl:    levothyroxine (SYNTHROID, LEVOTHROID) 50 MCG tablet, Take 50 mcg by mouth daily before breakfast., Disp: , Rfl:    metFORMIN (GLUCOPHAGE) 500 MG tablet, Take by mouth 2 (two) times daily with a meal., Disp: , Rfl:    rosuvastatin (CRESTOR) 40 MG tablet, Take 40 mg by mouth daily., Disp: , Rfl:   No Known Allergies   ROS  As noted in HPI.   Physical Exam  BP 137/72 (BP Location: Right Arm)    Pulse 68    Temp 98.2 F (36.8 C) (Oral)    Resp 14    Ht 5\' 4"  (1.626 m)    Wt 84.4 kg    SpO2 98%    BMI 31.93 kg/m   Constitutional: Well developed, well nourished, no acute distress.  Coughing. Eyes: PERRL, EOMI, conjunctiva normal bilaterally HENT: Normocephalic, atraumatic,mucus membranes moist.  Positive purulent nasal congestion, normal turbinates.  Positive for left maxillary sinus tenderness.  Normal oropharynx.  Positive postnasal drip. Respiratory: Clear to auscultation bilaterally, no rales, no wheezing, no rhonchi Cardiovascular: Normal rate and rhythm, positive murmur, no  gallops, no rubs GI: Nondistended skin: No rash, skin intact Musculoskeletal: No deformities Neurologic: Alert & oriented x 3, CN III-XII grossly intact, no motor deficits, sensation grossly intact Psychiatric: Speech and behavior appropriate   ED Course   Medications - No data to display  No orders of the defined types were placed in this encounter.  No results found for this or any previous visit (from the past 24 hour(s)). No results found.  ED Clinical Impression  1. Acute non-recurrent maxillary sinusitis      ED Assessment/Plan   Did not test for COVID or flu because she is out of the treatment window for both.  Concern for a sinus infection.  Home with Augmentin, saline nasal irrigation, Flonase, continue Mucinex, Tessalon, hot water/honey/lemon as often as she wants.  Follow-up with PMD as needed.  Discussed MDM, treatment plan, and plan for follow-up with patient  patient agrees with plan.   Meds ordered this encounter  Medications   amoxicillin-clavulanate (AUGMENTIN) 875-125 MG tablet    Sig: Take 1 tablet by mouth 2 (two) times daily. X 7 days    Dispense:  14 tablet    Refill:  0   fluticasone (FLONASE) 50 MCG/ACT nasal spray    Sig: Place 2 sprays into both nostrils daily.    Dispense:  16 g    Refill:  0   benzonatate (TESSALON) 200 MG capsule    Sig: Take 1 capsule (200 mg total) by mouth 3 (three) times daily as needed for cough.    Dispense:  30 capsule    Refill:  0      *This clinic note was created using [employer]. Therefore, there may be occasional mistakes despite careful proofreading. ?    Julie Ripple, MD 01/30/21 1044

## 2021-12-10 ENCOUNTER — Other Ambulatory Visit: Payer: Self-pay | Admitting: Family Medicine

## 2021-12-10 DIAGNOSIS — Z1231 Encounter for screening mammogram for malignant neoplasm of breast: Secondary | ICD-10-CM

## 2022-01-13 ENCOUNTER — Ambulatory Visit
Admission: RE | Admit: 2022-01-13 | Discharge: 2022-01-13 | Disposition: A | Discharge status: Home / Self Care | Payer: Medicare Other | Source: Ambulatory Visit | Attending: Family Medicine | Admitting: Family Medicine

## 2022-01-13 DIAGNOSIS — Z1231 Encounter for screening mammogram for malignant neoplasm of breast: Secondary | ICD-10-CM | POA: Diagnosis present

## 2022-08-19 IMAGING — MG MM DIGITAL SCREENING BILAT W/ TOMO AND CAD
8 series · 8 of 24 positions shown · non-contrast
Comparison: Previous exam(s).

CLINICAL DATA: Screening.

EXAM:
DIGITAL SCREENING BILATERAL MAMMOGRAM WITH TOMOSYNTHESIS AND CAD
TECHNIQUE: Bilateral screening digital craniocaudal and mediolateral oblique
mammograms were obtained. Bilateral screening digital breast
tomosynthesis was performed. The images were evaluated with
computer-aided detection.

[R MLO synth-2D]
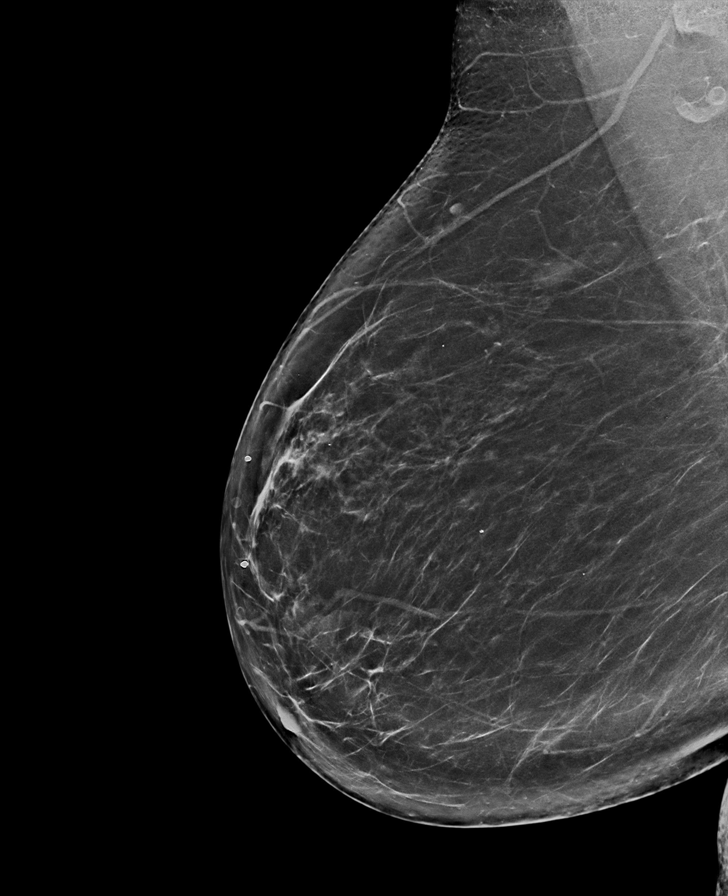

[R CC synth-2D]
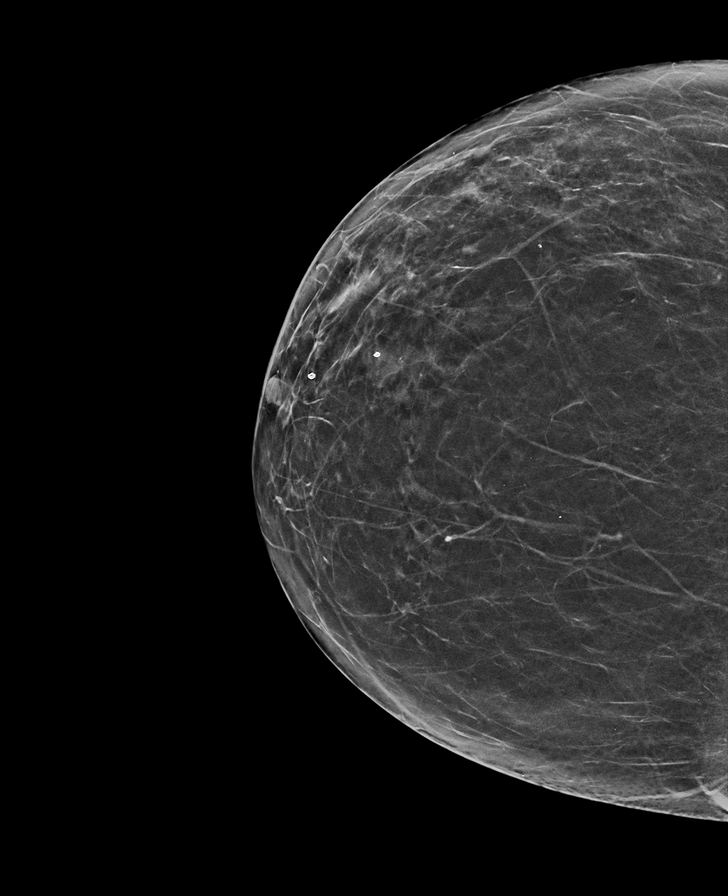

[L MLO synth-2D]
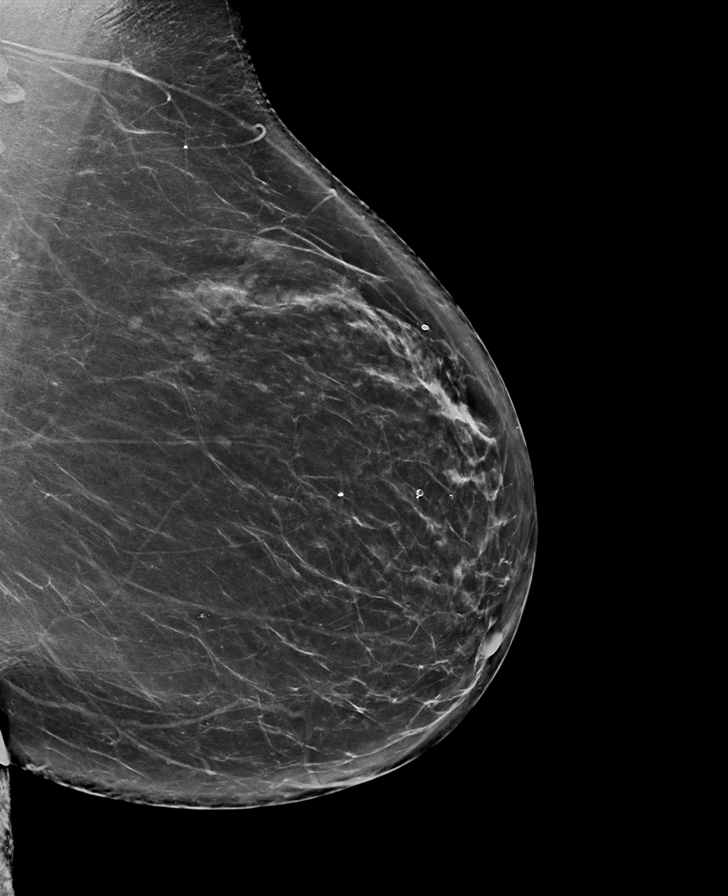

[L CC synth-2D]
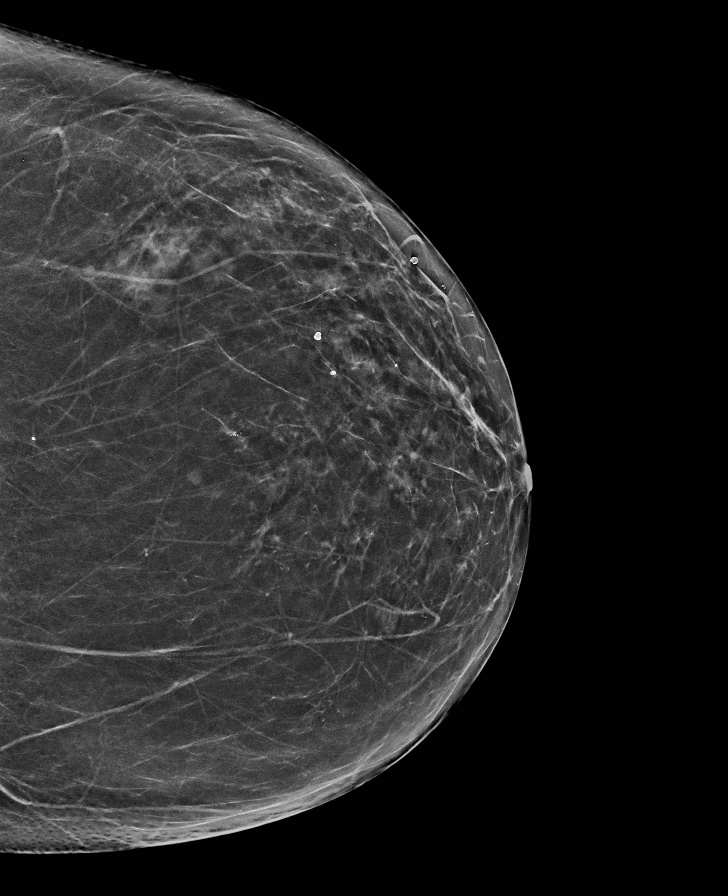

[L MLO tomo · tomo slice 41/80.0]
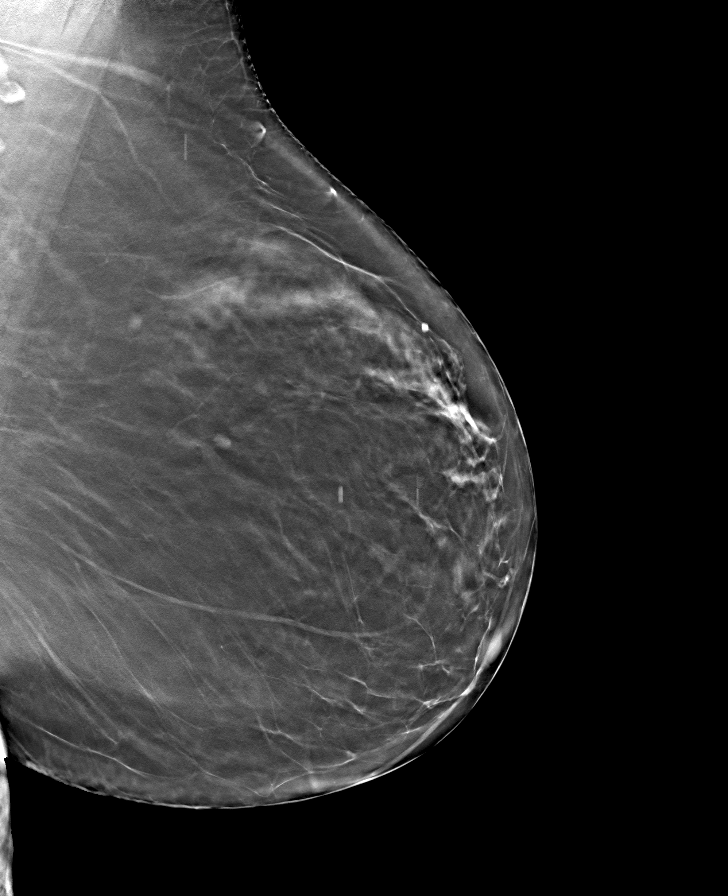

[L CC tomo · tomo slice 33/65.0]
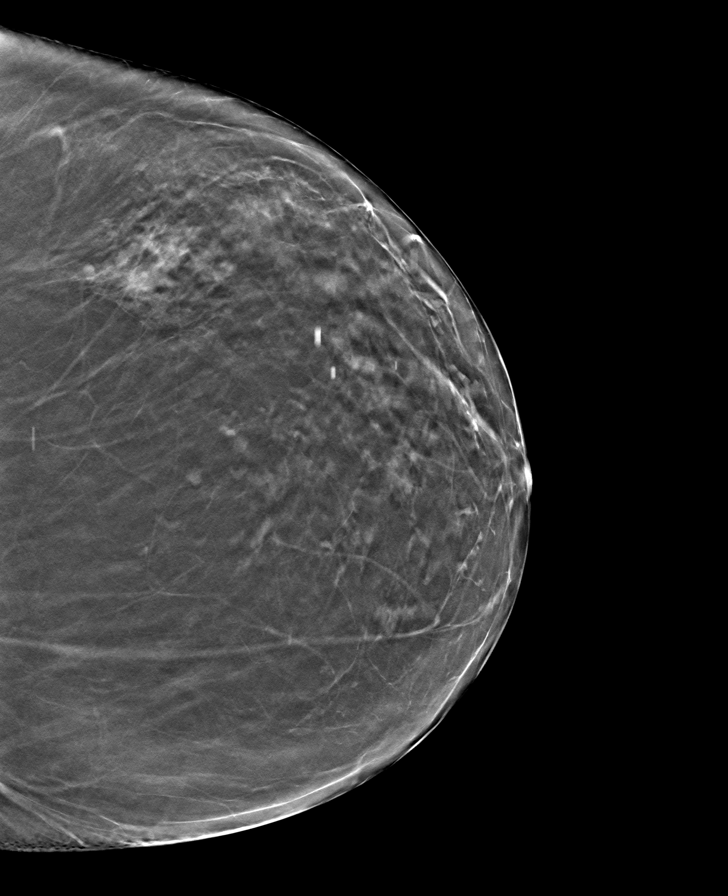

[R CC tomo · tomo slice 33/64.0]
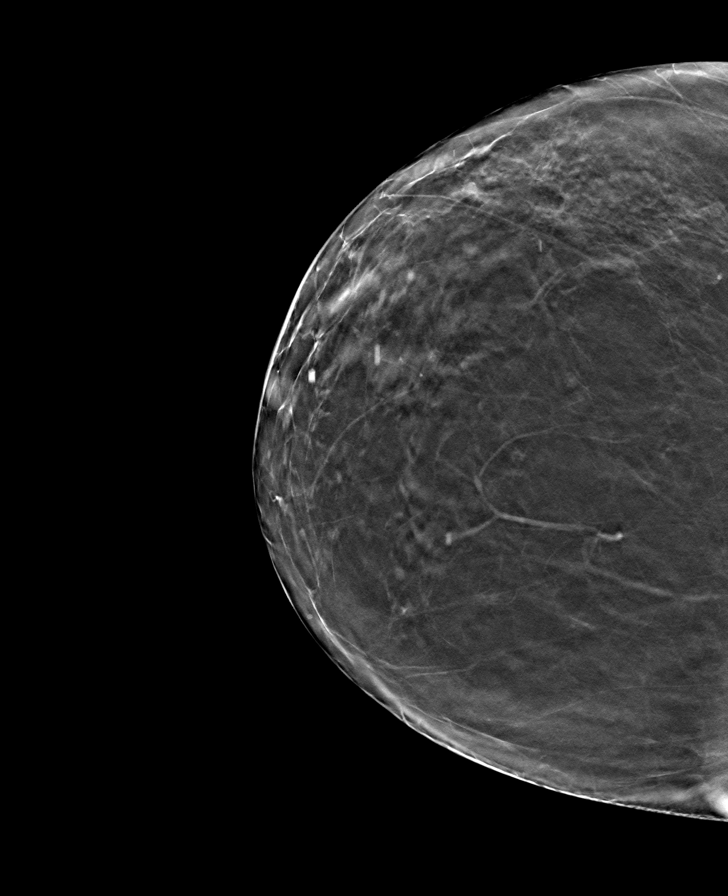

[R MLO tomo · tomo slice 39/76.0]
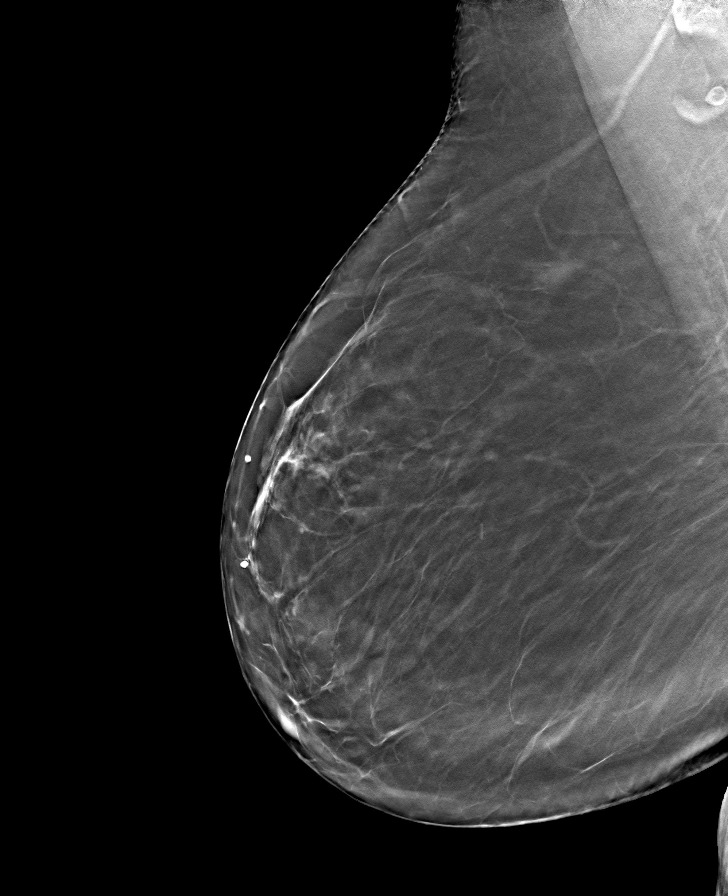

[8 of 24 positions shown; findings below may reference images not displayed]

ACR Breast Density Category b: There are scattered areas of
fibroglandular density.
FINDINGS: There are no findings suspicious for malignancy.
IMPRESSION: No mammographic evidence of malignancy. A result letter of this
screening mammogram will be mailed directly to the patient.

RECOMMENDATION:
Screening mammogram in one year. (Code:51-O-LD2)

BI-RADS CATEGORY  1: Negative.

## 2022-12-28 ENCOUNTER — Encounter
Admission: RE | Admit: 2022-12-28 | Discharge: 2022-12-28 | Disposition: A | Discharge status: Home / Self Care | Payer: Medicare Other | Source: Ambulatory Visit | Attending: Orthopedic Surgery | Admitting: Orthopedic Surgery

## 2022-12-28 VITALS — BP 142/66 | HR 64 | Resp 14 | Ht 64.0 in | Wt 176.8 lb

## 2022-12-28 DIAGNOSIS — Z01810 Encounter for preprocedural cardiovascular examination: Secondary | ICD-10-CM | POA: Diagnosis not present

## 2022-12-28 DIAGNOSIS — Z01818 Encounter for other preprocedural examination: Secondary | ICD-10-CM | POA: Diagnosis present

## 2022-12-28 DIAGNOSIS — N183 Chronic kidney disease, stage 3 unspecified: Secondary | ICD-10-CM | POA: Insufficient documentation

## 2022-12-28 DIAGNOSIS — E1122 Type 2 diabetes mellitus with diabetic chronic kidney disease: Secondary | ICD-10-CM | POA: Insufficient documentation

## 2022-12-28 HISTORY — DX: Type 2 diabetes mellitus without complications: E11.9

## 2022-12-28 HISTORY — DX: Occlusion and stenosis of bilateral carotid arteries: I65.23

## 2022-12-28 HISTORY — DX: Nonrheumatic aortic (valve) stenosis: I35.0

## 2022-12-28 HISTORY — DX: Atherosclerotic heart disease of native coronary artery without angina pectoris: I25.10

## 2022-12-28 HISTORY — DX: Chronic kidney disease, unspecified: N18.9

## 2022-12-28 HISTORY — DX: Unilateral primary osteoarthritis, right knee: M17.11

## 2022-12-28 LAB — HEMOGLOBIN A1C
Hgb A1c MFr Bld: 6.5 % — ABNORMAL HIGH (ref 4.8–5.6)
Mean Plasma Glucose: 139.85 mg/dL

## 2022-12-28 LAB — URINALYSIS, ROUTINE W REFLEX MICROSCOPIC
Bacteria, UA: NONE SEEN
Bilirubin Urine: NEGATIVE
Glucose, UA: NEGATIVE mg/dL
Ketones, ur: NEGATIVE mg/dL
Nitrite: NEGATIVE
Protein, ur: NEGATIVE mg/dL
Specific Gravity, Urine: 1.014 (ref 1.005–1.030)
pH: 5 (ref 5.0–8.0)

## 2022-12-28 LAB — COMPREHENSIVE METABOLIC PANEL
ALT: 24 U/L (ref 0–44)
AST: 23 U/L (ref 15–41)
Albumin: 4.3 g/dL (ref 3.5–5.0)
Alkaline Phosphatase: 43 U/L (ref 38–126)
Anion gap: 8 (ref 5–15)
BUN: 31 mg/dL — ABNORMAL HIGH (ref 8–23)
CO2: 27 mmol/L (ref 22–32)
Calcium: 9.5 mg/dL (ref 8.9–10.3)
Chloride: 103 mmol/L (ref 98–111)
Creatinine, Ser: 0.92 mg/dL (ref 0.44–1.00)
GFR, Estimated: >60 mL/min (ref >60)
Glucose, Bld: 88 mg/dL (ref 70–99)
Potassium: 3.8 mmol/L (ref 3.5–5.1)
Sodium: 138 mmol/L (ref 135–145)
Total Bilirubin: 0.7 mg/dL (ref <1.2)
Total Protein: 7.4 g/dL (ref 6.5–8.1)

## 2022-12-28 LAB — SEDIMENTATION RATE: Sed Rate: 118 mm/h — ABNORMAL HIGH (ref 0–30)

## 2022-12-28 LAB — SURGICAL PCR SCREEN
MRSA, PCR: NEGATIVE
Staphylococcus aureus: NEGATIVE

## 2022-12-28 LAB — C-REACTIVE PROTEIN: CRP: <0.5 mg/dL (ref <1.0)

## 2022-12-28 NOTE — Progress Notes (Signed)
  [facility] Perioperative Services: Pre-Admission/Anesthesia Testing  Abnormal Lab Notification and Treatment Plan of Care  Date: 12/28/22  Name: Julie Torres MRN:   [id number]  Re: Abnormal labs noted during PAT appointment   Notified:  Provider Name Provider Role Notification Mode  Francesco Sor, MD Orthopedics (Surgeon) Routed and/or faxed via [facility]   Abnormal Lab Value(s):   Lab Results  Component Value Date   COLORURINE YELLOW (A) 12/28/2022   APPEARANCEUR CLEAR (A) 12/28/2022   LABSPEC 1.014 12/28/2022   PHURINE 5.0 12/28/2022   GLUCOSEU NEGATIVE 12/28/2022   HGBUR SMALL (A) 12/28/2022   BILIRUBINUR NEGATIVE 12/28/2022   KETONESUR NEGATIVE 12/28/2022   PROTEINUR NEGATIVE 12/28/2022   NITRITE NEGATIVE 12/28/2022   LEUKOCYTESUR LARGE (A) 12/28/2022   EPIU 0-5 12/28/2022   WBCU 21-50 12/28/2022   RBCU 0-5 12/28/2022   BACTERIA NONE SEEN 12/28/2022    Clinical Information and Notes:  Patient is scheduled for COMPUTER ASSISTED TOTAL KNEE ARTHROPLASTY (Right: Knee) on 01/04/2023.    UA performed in PAT consistent with/concerning for infection.  Renal function: Estimated Creatinine Clearance: 52.5 mL/min (by C-G formula based on SCr of 0.92 mg/dL). Urine C&S added to assess for pathogenically significant growth.  Impression and Plan:  Julie Torres with a UA that was (+) for infection; reflex culture sent. Will plan on forwarding final culture results to MD as they become available to me. Sending results for review and consideration of preoperative treatment as deemed appropriate by Dr. Ernest Pine.   Quentin Mulling, MSN, APRN, FNP-C, CEN [facility]  Perioperative Services Nurse Practitioner Phone: [phone] Fax: [phone] 12/28/22 2:37 PM  NOTE: This note has been prepared using Dragon dictation software. Despite my best ability to proofread, there is always the potential that unintentional transcriptional errors  may still occur from this process.

## 2022-12-28 NOTE — Patient Instructions (Addendum)
Your procedure is scheduled on:01-04-23 Wednesday Report to the Registration Desk on the 1st floor of the [facility].Then proceed to the 2nd floor Surgery Desk To find out your arrival time, please call [phone] between 1PM - 3PM on:01-03-23 Tuesday If your arrival time is 6:00 am, do not arrive before that time as the [facility] entrance doors do not open until 6:00 am.  REMEMBER: Instructions that are not followed completely may result in serious medical risk, up to and including death; or upon the discretion of your surgeon and anesthesiologist your surgery may need to be rescheduled.  Do not eat food after midnight the night before surgery.  No gum chewing or hard candies.  You may however, drink WATER up to 2 hours before you are scheduled to arrive for your surgery. Do not drink anything within 2 hours of your scheduled arrival time  In addition, your doctor has ordered for you to drink the provided:  Gatorade G2 Drinking this carbohydrate drink up to two hours before surgery helps to reduce insulin resistance and improve patient outcomes. Please complete drinking 2 hours before scheduled arrival time.  One week prior to surgery:Last dose NOW (12-28-22) Stop Anti-inflammatories (NSAIDS) such as Advil, Aleve, Ibuprofen, Motrin, Naproxen, Naprosyn and Aspirin based products such as Excedrin, Goody's Powder, BC Powder. Stop ANY OVER THE COUNTER supplements until after surgery (Multivitamin)  You may however, continue to take Tylenol if needed for pain up until the day of surgery.  Stop metFORMIN (GLUCOPHAGE-XR) 2 days prior to surgery-Last dose will be on 01-01-23 Sunday  Last dose of 81 mg Aspirin was on 12-26-22  Continue taking all of your other prescription medications up until the day of surgery.  ON THE DAY OF SURGERY ONLY TAKE THESE MEDICATIONS WITH SIPS OF WATER: -amLODipine (NORVASC)  -levothyroxine (SYNTHROID, LEVOTHROID)   No Alcohol for 24 hours before or after  surgery.  No Smoking including e-cigarettes for 24 hours before surgery.  No chewable tobacco products for at least 6 hours before surgery.  No nicotine patches on the day of surgery.  Do not use any "recreational" drugs for at least a week (preferably 2 weeks) before your surgery.  Please be advised that the combination of cocaine and anesthesia may have negative outcomes, up to and including death. If you test positive for cocaine, your surgery will be cancelled.  On the morning of surgery brush your teeth with toothpaste and water, you may rinse your mouth with mouthwash if you wish. Do not swallow any toothpaste or mouthwash.  Use CHG Soap as directed on instruction sheet.  Do not wear jewelry, make-up, hairpins, clips or nail polish.  For welded (permanent) jewelry: bracelets, anklets, waist bands, etc.  Please have this removed prior to surgery.  If it is not removed, there is a chance that hospital personnel will need to cut it off on the day of surgery.  Do not wear lotions, powders, or perfumes.   Do not shave body hair from the neck down 48 hours before surgery.  Contact lenses, hearing aids and dentures may not be worn into surgery.  Do not bring valuables to the hospital. [facility] is not responsible for any missing/lost belongings or valuables.   Notify your doctor if there is any change in your medical condition (cold, fever, infection).  Wear comfortable clothing (specific to your surgery type) to the hospital.  After surgery, you can help prevent lung complications by doing breathing exercises.  Take deep breaths and  cough every 1-2 hours. Your doctor may order a device called an Incentive Spirometer to help you take deep breaths. When coughing or sneezing, hold a pillow firmly against your incision with both hands. This is called "splinting." Doing this helps protect your incision. It also decreases belly discomfort.  If you are being admitted to the hospital  overnight, leave your suitcase in the car. After surgery it may be brought to your room.  In case of increased patient census, it may be necessary for you, the patient, to continue your postoperative care in the Same Day Surgery department.  If you are being discharged the day of surgery, you will not be allowed to drive home. You will need a responsible individual to drive you home and stay with you for 24 hours after surgery.   If you are taking public transportation, you will need to have a responsible individual with you.  Please call the Pre-admissions Testing Dept. at [phone] if you have any questions about these instructions.  Surgery Visitation Policy:  Patients having surgery or a procedure may have two visitors.  Children under the age of 3 must have an adult with them who is not the patient.  Inpatient Visitation:    Visiting hours are 7 a.m. to 8 p.m. Up to four visitors are allowed at one time in a patient room. The visitors may rotate out with other people during the day.  One visitor age 27 or older may stay with the patient overnight and must be in the room by 8 p.m.    Pre-operative 5 CHG Bath Instructions   You can play a key role in reducing the risk of infection after surgery. Your skin needs to be as free of germs as possible. You can reduce the number of germs on your skin by washing with CHG (chlorhexidine gluconate) soap before surgery. CHG is an antiseptic soap that kills germs and continues to kill germs even after washing.   DO NOT use if you have an allergy to chlorhexidine/CHG or antibacterial soaps. If your skin becomes reddened or irritated, stop using the CHG and notify one of our RNs at [phone].   Please shower with the CHG soap starting 4 days before surgery using the following schedule:     Please keep in mind the following:  DO NOT shave, including legs and underarms, starting the day of your first shower.   You may shave your face  at any point before/day of surgery.  Place clean sheets on your bed the day you start using CHG soap. Use a clean washcloth (not used since being washed) for each shower. DO NOT sleep with pets once you start using the CHG.   CHG Shower Instructions:  If you choose to wash your hair and private area, wash first with your normal shampoo/soap.  After you use shampoo/soap, rinse your hair and body thoroughly to remove shampoo/soap residue.  Turn the water OFF and apply about 3 tablespoons (45 ml) of CHG soap to a CLEAN washcloth.  Apply CHG soap ONLY FROM YOUR NECK DOWN TO YOUR TOES (washing for 3-5 minutes)  DO NOT use CHG soap on face, private areas, open wounds, or sores.  Pay special attention to the area where your surgery is being performed.  If you are having back surgery, having someone wash your back for you may be helpful. Wait 2 minutes after CHG soap is applied, then you may rinse off the CHG soap.  Pat dry with  a clean towel  Put on clean clothes/pajamas   If you choose to wear lotion, please use ONLY the CHG-compatible lotions on the back of this paper.     Additional instructions for the day of surgery: DO NOT APPLY any lotions, deodorants, cologne, or perfumes.   Put on clean/comfortable clothes.  Brush your teeth.  Ask your nurse before applying any prescription medications to the skin.      CHG Compatible Lotions   Aveeno Moisturizing lotion  Cetaphil Moisturizing Cream  Cetaphil Moisturizing Lotion  Clairol Herbal Essence Moisturizing Lotion, Dry Skin  Clairol Herbal Essence Moisturizing Lotion, Extra Dry Skin  Clairol Herbal Essence Moisturizing Lotion, Normal Skin  Curel Age Defying Therapeutic Moisturizing Lotion with Alpha Hydroxy  Curel Extreme Care Body Lotion  Curel Soothing Hands Moisturizing Hand Lotion  Curel Therapeutic Moisturizing Cream, Fragrance-Free  Curel Therapeutic Moisturizing Lotion, Fragrance-Free  Curel Therapeutic Moisturizing Lotion,  Original Formula  Eucerin Daily Replenishing Lotion  Eucerin Dry Skin Therapy Plus Alpha Hydroxy Crme  Eucerin Dry Skin Therapy Plus Alpha Hydroxy Lotion  Eucerin Original Crme  Eucerin Original Lotion  Eucerin Plus Crme Eucerin Plus Lotion  Eucerin TriLipid Replenishing Lotion  Keri Anti-Bacterial Hand Lotion  Keri Deep Conditioning Original Lotion Dry Skin Formula Softly Scented  Keri Deep Conditioning Original Lotion, Fragrance Free Sensitive Skin Formula  Keri Lotion Fast Absorbing Fragrance Free Sensitive Skin Formula  Keri Lotion Fast Absorbing Softly Scented Dry Skin Formula  Keri Original Lotion  Keri Skin Renewal Lotion Keri Silky Smooth Lotion  Keri Silky Smooth Sensitive Skin Lotion  Nivea Body Creamy Conditioning Oil  Nivea Body Extra Enriched Lotion  Nivea Body Original Lotion  Nivea Body Sheer Moisturizing Lotion Nivea Crme  Nivea Skin Firming Lotion  NutraDerm 30 Skin Lotion  NutraDerm Skin Lotion  NutraDerm Therapeutic Skin Cream  NutraDerm Therapeutic Skin Lotion  ProShield Protective Hand Cream  Provon moisturizing lotion  How to Use an Incentive Spirometer An incentive spirometer is a tool that measures how well you are filling your lungs with each breath. Learning to take long, deep breaths using this tool can help you keep your lungs clear and active. This may help to reverse or lessen your chance of developing breathing (pulmonary) problems, especially infection. You may be asked to use a spirometer: After a surgery. If you have a lung problem or a history of smoking. After a long period of time when you have been unable to move or be active. If the spirometer includes an indicator to show the highest number that you have reached, your health care provider or respiratory therapist will help you set a goal. Keep a log of your progress as told by your health care provider. What are the risks? Breathing too quickly may cause dizziness or cause you to pass  out. Take your time so you do not get dizzy or light-headed. If you are in pain, you may need to take pain medicine before doing incentive spirometry. It is harder to take a deep breath if you are having pain. How to use your incentive spirometer  Sit up on the edge of your bed or on a chair. Hold the incentive spirometer so that it is in an upright position. Before you use the spirometer, breathe out normally. Place the mouthpiece in your mouth. Make sure your lips are closed tightly around it. Breathe in slowly and as deeply as you can through your mouth, causing the piston or the ball to rise toward the top of  the chamber. Hold your breath for 3-5 seconds, or for as long as possible. If the spirometer includes a coach indicator, use this to guide you in breathing. Slow down your breathing if the indicator goes above the marked areas. Remove the mouthpiece from your mouth and breathe out normally. The piston or ball will return to the bottom of the chamber. Rest for a few seconds, then repeat the steps 10 or more times. Take your time and take a few normal breaths between deep breaths so that you do not get dizzy or light-headed. Do this every 1-2 hours when you are awake. If the spirometer includes a goal marker to show the highest number you have reached (best effort), use this as a goal to work toward during each repetition. After each set of 10 deep breaths, cough a few times. This will help to make sure that your lungs are clear. If you have an incision on your chest or abdomen from surgery, place a pillow or a rolled-up towel firmly against the incision when you cough. This can help to reduce pain while taking deep breaths and coughing. General tips When you are able to get out of bed: Walk around often. Continue to take deep breaths and cough in order to clear your lungs. Keep using the incentive spirometer until your health care provider says it is okay to stop using it. If you have  been in the hospital, you may be told to keep using the spirometer at home. Contact a health care provider if: You are having difficulty using the spirometer. You have trouble using the spirometer as often as instructed. Your pain medicine is not giving enough relief for you to use the spirometer as told. You have a fever. Get help right away if: You develop shortness of breath. You develop a cough with bloody mucus from the lungs. You have fluid or blood coming from an incision site after you cough. Summary An incentive spirometer is a tool that can help you learn to take long, deep breaths to keep your lungs clear and active. You may be asked to use a spirometer after a surgery, if you have a lung problem or a history of smoking, or if you have been inactive for a long period of time. Use your incentive spirometer as instructed every 1-2 hours while you are awake. If you have an incision on your chest or abdomen, place a pillow or a rolled-up towel firmly against your incision when you cough. This will help to reduce pain. Get help right away if you have shortness of breath, you cough up bloody mucus, or blood comes from your incision when you cough. This information is not intended to replace advice given to you by your health care provider. Make sure you discuss any questions you have with your health care provider. Document Revised: 04/08/2019 Document Reviewed: 04/08/2019 Elsevier Patient Education  2024 [employer].

## 2022-12-29 NOTE — H&P (Signed)
ORTHOPAEDIC HISTORY & PHYSICAL Julie Torres, [name] - 12/26/2022 3:15 PM EST Formatting of this note is different from the original. Images from the original note were not included. Chief Complaint: Chief Complaint Patient presents with Right Knee - Pain, Pre-op Exam  Reason for Visit: The patient is a 77 y.o. female who presents today for history and physical for an upcoming right total knee arthroplasty to be done by Dr. Ernest Pine on January 04, 2023. 4. She has a long history of right knee pain. She localizes most of the pain along the medial aspect of the knee. She reports some swelling, no locking, and some giving way of the knee. The pain is aggravated by any weight bearing. The knee pain limits the patient's ability to ambulate long distances. The patient has not appreciated any significant improvement despite Tylenol, NSAIDs, intraarticular corticosteroid injections, viscosupplementation, and activity modification. She is not using any ambulatory aids. The patient states that the knee pain has progressed to the point that it is significantly interfering with her activities of daily living. The patient is a diabetic with well-controlled blood sugar with a hemoglobin A1c of 6.3.  Of note, s her husband passed away 2 months ago from longstanding battle with cancer.  Medications: Current Outpatient Medications Medication Sig Dispense Refill amLODIPine (NORVASC) 10 MG tablet TAKE 1 TABLET(10 MG) BY MOUTH EVERY DAY 100 tablet 0 aspirin 81 mg tablet 1 tab by mouth daily blood glucose diagnostic test strip Use once daily Use as instructed. FREESTYLE LITE 100 each 3 blood glucose meter kit Use as directed Freestyle Freedom Lite. 1 each 0 enalapril (VASOTEC) 20 MG tablet Take 1 tablet (20 mg total) by mouth 2 (two) times daily 180 tablet 2 ezetimibe (ZETIA) 10 mg tablet TAKE 1 TABLET(10 MG) BY MOUTH EVERY DAY 90 tablet 1 fenofibrate nanocrystallized (TRICOR) 145 MG tablet Take 1 tablet (145 mg  total) by mouth once daily 90 tablet 3 hydroCHLOROthiazide (HYDRODIURIL) 25 MG tablet Take 1 tablet (25 mg total) by mouth once daily 100 tablet 1 lancets (ONETOUCH ULTRASOFT) TEST SUGAR ONCE A DAY 5 levothyroxine (SYNTHROID) 50 MCG tablet TAKE 1 TABLET BY MOUTH EVERY DAY, BUT ON TUESDAY AND THURSDAY TAKE 2 TABLETS 120 tablet 8 metFORMIN (GLUCOPHAGE-XR) 500 MG XR tablet TAKE 2 TABLETS(1000 MG) BY MOUTH DAILY WITH DINNER 180 tablet 3 rosuvastatin (CRESTOR) 40 MG tablet Take 1 tablet (40 mg total) by mouth at bedtime 90 tablet 3  No current facility-administered medications for this visit.  Allergies: No Known Allergies  Past Medical History: Past Medical History: Diagnosis Date Diabetes mellitus type 2, uncomplicated (CMS/HHS-HCC) GERD (gastroesophageal reflux disease) Heart murmur Osteoarthritis of right knee 03/16/2011 Pure hypercholesterolemia 09/17/2010 Undiagnosed cardiac murmurs 09/17/2010 Unspecified essential hypertension 09/17/2010 Unspecified hypothyroidism 09/17/2010  Past Surgical History: Past Surgical History: Procedure Laterality Date EXTRACTION CATARACT EXTRACAPSULAR W/INSERTION INTRAOCULAR PROSTHESIS Right 08/19/2013 Procedure: # LENSX / LRI / NANOFLEX # ( $600 + NO CHARGE )-R- EXTRACTION CATARACT EXTRACAPSULAR W/INSERTION INTRAOCULAR PROSTHESIS; Surgeon: Timoteo Ace, MD; Location: [facility] OR; Service: Ophthalmology; Laterality: Right; EXTRACTION CATARACT EXTRACAPSULAR W/INSERTION INTRAOCULAR PROSTHESIS Left 09/02/2013 Procedure: LENSX/LRI+NANO ($600+N/C)--L--EXTRACTION CATARACT EXTRACAPSULAR W/INSERTION INTRAOCULAR PROSTHESIS; Surgeon: Timoteo Ace, MD; Location: [facility] OR; Service: Ophthalmology; Laterality: Left; COLONOSCOPY 06/22/2015 Adenomatous Polyp: CBF 05/2020 COLONOSCOPY 09/21/2020 Diverticulosis/PHx CP/No repeat due to age/CTL CATARACT EXTRACTION bilat removal 2015 CESAREAN SECTION 1999 COLONOSCOPY 2007 ([place]) No polyps per  pt HYSTERECTOMY COMPLETE-FIBROIDS KNEE ARTHROSCOPY right knee, torn meniscus  Social History: Social History  Socioeconomic History Marital status: Married Spouse name: Baldo Ash  Number of children: 3 Years of education: 12 Highest education level: High school graduate Occupational History Occupation: Publishing rights manager - Retired Tobacco Use Smoking status: Never Smokeless tobacco: Never Vaping Use Vaping status: Never Used Substance and Sexual Activity Alcohol use: No Drug use: No Sexual activity: Yes Partners: Male Social History Narrative Married. Moved from [place]. High school education. Previously worked as a Associate Professor. No alcohol. No tobacco use.  04/25/2022 Military Service: None Likes/Enjoys/What fills your day?: Narrative Home: One Oncologist is on: First Level Fewest Steps to enter the home: 4 With railings Other persons in the home: husband Pets: none Medical equipment you use daily: None Medical equipment available in the home: Crutches and Walker Dental: no significant dental problems. Last screening Date: March 2024 Screening is: Up to date. [employer]: Denies any issues with Vision","Wears Reading glasses","Prescription Glasses","Contact Lenses"}.. Last screening Date: September 2023 Screening is: Up to date. Dr. Randal Buba Hearing: Denies any issues in Hearing . Dermatology: Denies any areas of concern on skin. Last screening Date: November 2023 Screening is: Up to date. [facility]   Social Drivers of Health  Financial Resource Strain: Low Risk (04/25/2022) Overall Financial Resource Strain (CARDIA) Difficulty of Paying Living Expenses: Not hard at all Food Insecurity: No Food Insecurity (04/25/2022) Hunger Vital Sign Worried About Running Out of Food in the Last Year: Never true Ran Out of Food in the Last Year: Never true Transportation Needs: No Transportation Needs (04/25/2022) PRAPARE -  Contractor (Medical): No Lack of Transportation (Non-Medical): No Housing Stability: Unknown (04/25/2022) Housing Stability Vital Sign Unable to Pay for Housing in the Last Year: No Unstable Housing in the Last Year: No  Family History: Family History Problem Relation Name Age of Onset High blood pressure (Hypertension) Mother (cat sx) Peripheral vascular disease Mother (cat sx) Rheum arthritis Mother (cat sx) High blood pressure (Hypertension) Father Myocardial Infarction (Heart attack) Father Myopia Other siblings/parents  Review of Systems: A comprehensive 14 point ROS was performed, reviewed, and the pertinent orthopaedic findings are documented in the HPI.  Exam BP 128/62  Ht 163.8 cm (5' 4.5")  Wt 80 kg (176 lb 6.4 oz)  BMI 29.81 kg/m  General: Well-developed, well-nourished female seen in no acute distress. Antalgic gait. Varus thrust to the right knee.  HEENT: Atraumatic, normocephalic. Pupils are equal and reactive to light. Extraocular motion is intact. Sclera are clear. Oropharynx is clear with moist mucosa.  Neck: Supple, nontender, and with good ROM.  Lungs: Normal respiratory effort. No audible wheezing.  Cardiovascular: Peripheral pulses are palpable. No lower extremity edema. Homan`s test is negative.  Extremities: Good strength, stability, and range of motion of the upper extremities. Good range of motion of the hips and ankles.  Heart: Examination of the heart reveals regular, rate, and rhythm. There is systolic murmur noted on ascultation. There is a normal apical pulse.  Lungs: Lungs are clear to auscultation. There is no wheeze, rhonchi, or crackles. There is normal expansion of bilateral chest walls.  Right Knee: Soft tissue swelling: mild Effusion: minimal Erythema: none Crepitance: mild Tenderness: medial Alignment: relative varus Mediolateral laxity: medial pseudolaxity Posterior sag: negative Patellar  tracking: Good tracking without evidence of subluxation or tilt Atrophy: No significant atrophy. Quadriceps tone was fair to good. Range of motion: 0/0/118 degrees  Neurologic: Awake, alert, and oriented. Sensory function is intact to pinprick and light touch. Motor strength is judged to be 5/5. Motor coordination is within normal limits. No  apparent clonus. No tremor.  X-rays: Dr. Ernest Pine ordered and interpreted standing AP, lateral, and sunrise radiographs of the right knee that were obtained in the office today. There is significant narrowing of the medial cartilage space with bone-on-bone articulation and associated varus alignment. Osteophyte formation is noted. Subchondral sclerosis is noted. No evidence of fracture or dislocation.  Impression: Degenerative arthrosis of the right knee  Plan: The findings were discussed in detail with the patient. The patient was given informational material on total knee replacement. Conservative treatment options were reviewed with the patient. We discussed the risks and benefits of surgical intervention. The usual perioperative course was also discussed in detail. The patient expressed understanding of the risks and benefits of surgical intervention and would like to take this information under advisement. She will contact the office if she has additional questions or if she would like to schedule surgery.  Hemoglobin A1c is an indication of glucose control. Uncontrolled diabetes has been associated with perioperative complications including poor wound healing and surgical site infections. To decrease the risk of perioperative complications, hemoglobin A1c must be less than 8.0 prior to surgery. The patient is encouraged to work with her primary care physician and/or endocrinologist to optimize glucose management.  Lab Results Component Value Date HGBA1C 6.3 (H) 10/26/2022 HGBA1C 6.7 (H) 04/22/2022  I spent a total of 40 minutes in both  face-to-face and non-face-to-face activities, excluding procedures performed, for this visit on the date of this encounter.  ACTIVITY: As tolerated. WORK STATUS: Not applicable. THERAPY: Home exercise program for quadriceps strengthening MEDICATIONS: Requested Prescriptions  No prescriptions requested or ordered in this encounter  FOLLOW-UP: No follow-ups on file.  Expected discharge plan: The patient will plan to discharge home the following day after surgery. She will do physical therapy and complete physical therapy goals following surgery. She will have her Hemovac drain removed the day after surgery. She will plan to go home with home health physical therapy.  This note will serve as the history and physical for surgery scheduled on January 04, 2023 for a right total knee arthroplasty to be done by Dr. Ernest Pine.  JONATHAN T MUNDY PA-C, M.S.  This note was generated in part with voice recognition software and I apologize for any typographical errors that were not detected and corrected.  Electronically signed by Julie Bo, PA at 12/26/2022 3:31 PM EST

## 2022-12-31 LAB — URINE CULTURE: Culture: 30000 — AB

## 2023-01-02 ENCOUNTER — Encounter: Payer: Self-pay | Admitting: Urgent Care

## 2023-01-02 NOTE — Progress Notes (Signed)
Perioperative / Anesthesia Services  Pre-Admission Testing Clinical Review / Pre-Operative Anesthesia Consult  Date: 01/03/23  Patient Demographics:  Name: Julie Torres DOB:   03-04-1945 MRN:   [id number]  Planned Surgical Procedure(s):    Case: [id number] Date/Time: 01/04/23 1213   Procedure: COMPUTER ASSISTED TOTAL KNEE ARTHROPLASTY (Right: Knee)   Anesthesia type: Choice   Pre-op diagnosis: PRIMARY OSTEOARTHRITIS OF RIGHT KNEE.   Location: [facility] OR ROOM 01 / [facility]   Surgeons: Donato Heinz, MD     NOTE: Available PAT nursing documentation and vital signs have been reviewed. Clinical nursing staff has updated patient's PMH/PSHx, current medication list, and drug allergies/intolerances to ensure comprehensive history available to assist in medical decision making as it pertains to the aforementioned surgical procedure and anticipated anesthetic course. Extensive review of available clinical information personally performed. [facility] and PSHx updated with any diagnoses/procedures that  may have been inadvertently omitted during her intake with the pre-admission testing department's nursing staff.  Clinical Discussion:  Julie Torres is a 77 y.o. female who is submitted for pre-surgical anesthesia review and clearance prior to her undergoing the above procedure. Patient has never been a smoker. Pertinent PMH includes: aortic stenosis, diastolic dysfunction, BILATERAL carotid artery disease, cardiac murmur, IRBBB, HTN, HLD, T2DM, hypothyroidism, CKD-III, GERD (no daily Tx), OA, lumbosacral DDD.  Patient is followed by cardiology Julie Pares, MD). She was last seen in the cardiology clinic on 10/26/2022; notes reviewed. At the time of her clinic visit, patient doing well overall from a cardiovascular perspective. Patient reported fatigue associated with demands required in the care of her husband. Patient denied any chest pain, shortness of breath, PND,  orthopnea, palpitations, significant peripheral edema, weakness, vertiginous symptoms, or presyncope/syncope. Patient with a past medical history significant for cardiovascular diagnoses. Documented physical exam was grossly benign, providing no evidence of acute exacerbation and/or decompensation of the patient's known cardiovascular conditions. Of note, complete records regarding patient's cardiovascular history are unavailable at time of consult. She has received care in the state of [place]. Information gathered from patient/family report, as well as from notes provided by patient's local care providers.   Patient with known longstanding history aortic valve stenosis.  Degree of stenosis has been serially monitored by her cardiologist both in [place] and locally.  Most recent TTE was performed on 08/17/2021 revealing a normal left ventricular systolic function with an EF of >55%.  There was mild LVH. Left ventricular diastolic Doppler parameters consistent with abnormal relaxation (G1DD).  Mitral annulus mildly calcified.  Right ventricular size and function was normal.  There was trivial to mild pan valvular regurgitation.  RVSP 27.0 mmHg.  Mild aortic valve stenosis noted with a mean transvalvular pressure gradient of 20.0 mmHg; AVA (VTI) 0.75 cm.  Aorta was normal in size with no evidence of aneurysmal dilatation.  Blood pressure well controlled at 122/74 mmHg on currently prescribed ACEi (enalapril) and CCB (amlodipine) therapies.  Patient is on ezetimibe + fenofibrate + rosuvastatin for her HLD diagnosis and ASCVD prevention. T2DM well controlled on currently prescribed regimen; last HgbA1c was 6.3% when checked on 10/26/2022. She does not have an OSAH diagnosis. Patient is able to complete all of her  ADL/IADLs without cardiovascular limitation.  Per the [facility]AS[facility], patient is able to achieve at least 4 METS of physical activity without experiencing any significant degree of angina/anginal equivalent  symptoms. No changes were made to her medication regimen during her visit with cardiology.  Patient scheduled to follow-up  with outpatient cardiology in 6 months or sooner if needed.  Julie Torres is scheduled for an elective COMPUTER ASSISTED TOTAL KNEE ARTHROPLASTY (Right: Knee) on 01/04/2023 with Dr. Francesco Sor, MD.  Given patient's past medical history significant for cardiovascular diagnoses, presurgical cardiac clearance was sought by the PAT team. Per cardiology, "this patient is optimized for surgery and may proceed with the planned procedural course with a LOW risk of significant perioperative cardiovascular complications".  In review of her medication reconciliation, it is noted that patient is currently on prescribed daily antithrombotic therapy. She has been instructed on recommendations for holding her daily low dose ASE for 5 days prior to her procedure with plans to restart as soon as postoperative bleeding risk felt to be minimized by her attending surgeon. Of note, patient self discontinued ASA prior to recommended date. Last ASA dose taken was on 12/26/2022.  Patient denies previous perioperative complications with anesthesia in the past. In review of the available records, it is noted that patient underwent a general anesthetic course here at [facility] (ASA II) in 08/2020 without documented complications.      12/28/2022   11:10 AM 01/30/2021   10:04 AM 01/30/2021   10:02 AM  Vitals with BMI  Height 5\' 4"   5\' 4"   Weight 176 lbs 13 oz  186 lbs  BMI 30.33  31.91  Systolic 142 137   Diastolic 66 72   Pulse 64 68     Providers/Specialists:   NOTE: Primary physician provider listed below. Patient may have been seen by APP or partner within same practice.   PROVIDER ROLE / SPECIALTY LAST OV  Hooten, Illene Labrador, MD Orthopedics (Surgeon)  12/26/2022  Leim Fabry, MD Primary Care Provider  12/19/2022  Rudean Hitt, MD Cardiology   10/26/2022   Allergies:  Patient has no known allergies.  Current Home Medications:   No current facility-administered medications for this encounter.    amLODipine (NORVASC) 10 MG tablet   aspirin 81 MG tablet   enalapril (VASOTEC) 20 MG tablet   ezetimibe (ZETIA) 10 MG tablet   fenofibrate 54 MG tablet   hydrochlorothiazide (HYDRODIURIL) 25 MG tablet   levothyroxine (SYNTHROID, LEVOTHROID) 50 MCG tablet   metFORMIN (GLUCOPHAGE-XR) 500 MG 24 hr tablet   Multiple Vitamin (MULTIVITAMIN) tablet   rosuvastatin (CRESTOR) 40 MG tablet   History:   Past Medical History:  Diagnosis Date   Aortic valve stenosis    a.) TTE 07/30/2013: mild AS (MPG 12.8, AVA 1.4 cm2); b.) TTE 12/20/2016: mild AS (MPG 14.9, AVA 1.0 cm2); c.) TTE 05/10/2018: mild AS (MPG 13.3, AVA 1.2 cm2); d.) TTE 08/04/2020: mild AS (MPG 11.4, AVA 0.74 cm2); e.) TTE 08/17/2021: mild-mod (MPG 20, AVA 0.75 cm2)   Bilateral carotid artery stenosis    CKD (chronic kidney disease), stage III (HCC)    DDD (degenerative disc disease), lumbosacral    Diastolic dysfunction    a.) TTE 07/31/2014: EF >55%, G1DD, triv AR/MR/PR, mild TR, mild AS; b.) TTE 12/20/2016: EF >55%, mild LVH, G1DD, triv AR/MR/PR, mild TR, mild AS; c.) TTE 05/10/2018: EF >55%, mild LVH, G1DD, triv AR/MR/PR, mild TR, mild AS; d.) TTE 08/04/2020: EF >55%, mod LVH, G1DD, triv MR, mild AR/TR/PR, mild AS; e.) TTE 08/17/2021: EF >55%, mild LVH, G1DD, mild LAC, triv MR, mild AR/TR/PR, mild AS   DM (diabetes mellitus), type 2 (HCC)    GERD (gastroesophageal reflux disease)    Heart murmur 09/17/2010   History of bilateral  cataract extraction 2015   Hypertension    Hypothyroidism    Incomplete RBBB    Long-term use of aspirin therapy    Osteoarthritis of right knee    Pure hypercholesterolemia    Past Surgical History:  Procedure Laterality Date   ABDOMINAL HYSTERECTOMY     CATARACT EXTRACTION Bilateral    COLONOSCOPY WITH PROPOFOL N/A 06/22/2015   Procedure:  COLONOSCOPY WITH PROPOFOL;  Surgeon: Scot Jun, MD;  [facility]: [facility] ENDOSCOPY;  Service: Endoscopy;  Laterality: N/A;   COLONOSCOPY WITH PROPOFOL N/A 09/21/2020   Procedure: COLONOSCOPY WITH PROPOFOL;  Surgeon: Regis Bill, MD;  Location: [facility] ENDOSCOPY;  Service: Endoscopy;  Laterality: N/A;   KNEE SURGERY Right    torn meniscus   Family History  Problem Relation Age of Onset   Breast cancer Neg Hx    Social History   Tobacco Use   Smoking status: Never   Smokeless tobacco: Never  Vaping Use   Vaping status: Never Used  Substance Use Topics   Alcohol use: No   Drug use: No    Pertinent Clinical Results:  LABS:   No visits with results within 3 Day(s) from this visit.  Latest known visit with results is:  Hospital Outpatient Visit on 12/28/2022  Component Date Value Ref Range Status   MRSA, PCR 12/28/2022 NEGATIVE  NEGATIVE Final   Staphylococcus aureus 12/28/2022 NEGATIVE  NEGATIVE Final   Comment: (NOTE) The Xpert SA Assay (FDA approved for NASAL specimens in patients 31 years of age and older), is one component of a comprehensive surveillance program. It is not intended to diagnose infection nor to guide or monitor treatment. Performed at [facility], [address] [place], [place] [address]    CRP 12/28/2022 <0.5  <1.0 mg/dL Final   Performed at [facility], [address] [place], [place] [address]   Sed Rate 12/28/2022 118 (H)  0 - 30 mm/hr Final   Performed at [facility], [address] [place], [place] [address]   Hgb A1c MFr Bld 12/28/2022 6.5 (H)  4.8 - 5.6 % Final   Comment: (NOTE) Pre diabetes:          5.7%-6.4%  Diabetes:              >6.4%  Glycemic control for   <7.0% adults with diabetes    Mean Plasma Glucose 12/28/2022 139.85  mg/dL Final   Performed at [facility], [address], [place], [place] [address]   Sodium 12/28/2022 138  135 - 145 mmol/L Final   Potassium 12/28/2022 3.8  3.5 -  5.1 mmol/L Final   Chloride 12/28/2022 103  98 - 111 mmol/L Final   CO2 12/28/2022 27  22 - 32 mmol/L Final   Glucose, Bld 12/28/2022 88  70 - 99 mg/dL Final   Glucose reference range applies only to samples taken after fasting for at least 8 hours.   BUN 12/28/2022 31 (H)  8 - 23 mg/dL Final   Creatinine, Ser 12/28/2022 0.92  0.44 - 1.00 mg/dL Final   Calcium 87/56/4332 9.5  8.9 - 10.3 mg/dL Final   Total Protein 95/18/8416 7.4  6.5 - 8.1 g/dL Final   Albumin 60/63/0160 4.3  3.5 - 5.0 g/dL Final   AST 10/93/2355 23  15 - 41 U/L Final   ALT 12/28/2022 24  0 - 44 U/L Final   Alkaline Phosphatase 12/28/2022 43  38 - 126 U/L Final   Total  Bilirubin 12/28/2022 0.7  <1.2 mg/dL Final   GFR, Estimated 12/28/2022 >60  >60 mL/min Final   Comment: (NOTE) Calculated using the CKD-EPI Creatinine Equation (2021)    Anion gap 12/28/2022 8  5 - 15 Final   Performed at [facility], [address] [place], [place] [address]   Color, Urine 12/28/2022 YELLOW (A)  YELLOW Final   APPearance 12/28/2022 CLEAR (A)  CLEAR Final   Specific Gravity, Urine 12/28/2022 1.014  1.005 - 1.030 Final   pH 12/28/2022 5.0  5.0 - 8.0 Final   Glucose, UA 12/28/2022 NEGATIVE  NEGATIVE mg/dL Final   Hgb urine dipstick 12/28/2022 SMALL (A)  NEGATIVE Final   Bilirubin Urine 12/28/2022 NEGATIVE  NEGATIVE Final   Ketones, ur 12/28/2022 NEGATIVE  NEGATIVE mg/dL Final   Protein, ur 66/07/3014 NEGATIVE  NEGATIVE mg/dL Final   Nitrite 02/08/3233 NEGATIVE  NEGATIVE Final   Leukocytes,Ua 12/28/2022 LARGE (A)  NEGATIVE Final   RBC / HPF 12/28/2022 0-5  0 - 5 RBC/hpf Final   WBC, UA 12/28/2022 21-50  0 - 5 WBC/hpf Final   Bacteria, UA 12/28/2022 NONE SEEN  NONE SEEN Final   Squamous Epithelial / HPF 12/28/2022 0-5  0 - 5 /HPF Final   Performed at [facility], [address] [place], [place] [address]    ECG: Date: 12/28/2022 Time ECG obtained: 1149 AM Rate: 59 bpm Rhythm: sinus bradycardia Axis (leads  I and aVF): Normal Intervals: PR 170 ms. QRS 100 ms. QTc 423 ms. ST segment and T wave changes: No evidence of acute ST segment elevation or depression.   Comparison: Similar to previous tracing obtained on 06/28/2019   IMAGING / PROCEDURES: DIAGNOSTIC RADIOGRAPHS OF LUMBAR SPINE 2-3 VIEWS performed on 12/19/2022 Segmentation: Five nonrib-bearing lumbar vertebrae are visualized.  Alignment: Normal alignment.  Bones: The vertebral body heights are preserved.  Degenerative changes: Moderate multilevel discogenic degenerative change  most pronounced at L5-S1. Lower lumbar facet arthrosis. Degenerative changes of the bilateral SI joints. Soft Tissues: Unremarkable.   DIAGNOSTIC RADIOGRAPHS OF RIGHT KNEE performed on 07/07/2022 Significant narrowing of the medial cartilage space with bone-on-bone articulation and associated varus alignment.   Osteophyte formation is noted.  Subchondral sclerosis is noted.   No evidence of fracture or dislocation.  TRANSTHORACIC ECHOCARDIOGRAM performed on 08/17/2021 Normal left ventricular systolic function with an EF of >55% Mild LVH Left ventricular diastolic Doppler parameters consistent with abnormal relaxation (G1DD). Mild concentric mitral annular calcification Trivial MR Mild AR, TR, PR Mild to moderate aortic stenosis; mean transaortic valve gradient 20.0 mmHg; AVA (VTI) = 0.75 cm  Impression and Plan:  Julie Torres has been referred for pre-anesthesia review and clearance prior to her undergoing the planned anesthetic and procedural courses. Available labs, pertinent testing, and imaging results were personally reviewed by me in preparation for upcoming operative/procedural course. [facility] medical record has been updated following extensive record review and patient interview with PAT staff.   This patient has been appropriately cleared by cardiology with an overall LOW risk of experiencing significant perioperative cardiovascular  complications. Based on clinical review performed today (01/03/23), barring any significant acute changes in the patient's overall condition, it is anticipated that she will be able to proceed with the planned surgical intervention. Any acute changes in clinical condition may necessitate her procedure being postponed and/or cancelled. Patient will meet with anesthesia team (MD and/or CRNA) on the day of her procedure for preoperative evaluation/assessment. Questions regarding anesthetic course will be fielded at that  time.   Pre-surgical instructions were reviewed with the patient during her PAT appointment, and questions were fielded to satisfaction by PAT clinical staff. She has been instructed on which medications that she will need to hold prior to surgery, as well as the ones that have been deemed safe/appropriate to take on the day of her procedure. As part of the general education provided by PAT, patient made aware both verbally and in writing, that she would need to abstain from the use of any illegal substances during her perioperative course.  She was advised that failure to follow the provided instructions could necessitate case cancellation or result in serious perioperative complications up to and including death. Patient encouraged to contact PAT and/or her surgeon's office to discuss any questions or concerns that may arise prior to surgery; verbalized understanding.   Quentin Mulling, MSN, APRN, FNP-C, CEN [facility]  Perioperative Services Nurse Practitioner Phone: [phone] Fax: [phone] 01/03/23 11:22 AM  NOTE: This note has been prepared using Dragon dictation software. Despite my best ability to proofread, there is always the potential that unintentional transcriptional errors may still occur from this process.

## 2023-01-03 MED ORDER — TRANEXAMIC ACID-NACL 1000-0.7 MG/100ML-% IV SOLN
1000.0000 mg | INTRAVENOUS | Status: AC
  Administered 2023-01-04: 1000 mg via INTRAVENOUS

## 2023-01-03 MED ORDER — CHLORHEXIDINE GLUCONATE 0.12 % MT SOLN
15.0000 mL | Freq: Once | OROMUCOSAL | Status: AC
  Administered 2023-01-04: 15 mL via OROMUCOSAL

## 2023-01-03 MED ORDER — CHLORHEXIDINE GLUCONATE 4 % EX SOLN: 60.0000 mL | Freq: Once | CUTANEOUS | Status: DC

## 2023-01-03 MED ORDER — GABAPENTIN 300 MG PO CAPS
300.0000 mg | ORAL_CAPSULE | Freq: Once | ORAL | Status: AC
  Administered 2023-01-04: 300 mg via ORAL

## 2023-01-03 MED ORDER — SODIUM CHLORIDE 0.9 % IV SOLN: INTRAVENOUS | Status: DC

## 2023-01-03 MED ORDER — DEXAMETHASONE SODIUM PHOSPHATE 10 MG/ML IJ SOLN
8.0000 mg | Freq: Once | INTRAMUSCULAR | Status: AC
  Administered 2023-01-04: 8 mg via INTRAVENOUS

## 2023-01-03 MED ORDER — CEFAZOLIN SODIUM-DEXTROSE 2-4 GM/100ML-% IV SOLN
2.0000 g | INTRAVENOUS | Status: AC
  Administered 2023-01-04: 2 g via INTRAVENOUS

## 2023-01-03 MED ORDER — CELECOXIB 200 MG PO CAPS
400.0000 mg | ORAL_CAPSULE | Freq: Once | ORAL | Status: AC
  Administered 2023-01-04: 400 mg via ORAL

## 2023-01-03 MED ORDER — ORAL CARE MOUTH RINSE: 15.0000 mL | Freq: Once | OROMUCOSAL | Status: AC

## 2023-01-04 ENCOUNTER — Observation Stay: Payer: Medicare Other

## 2023-01-04 ENCOUNTER — Ambulatory Visit: Payer: Medicare Other | Admitting: Urgent Care

## 2023-01-04 ENCOUNTER — Observation Stay
Admission: RE | Admit: 2023-01-04 | Discharge: 2023-01-05 | Disposition: A | Discharge status: Home / Self Care | Payer: Medicare Other | Attending: Orthopedic Surgery | Admitting: Orthopedic Surgery

## 2023-01-04 ENCOUNTER — Other Ambulatory Visit: Payer: Self-pay

## 2023-01-04 ENCOUNTER — Encounter: Payer: Self-pay | Admitting: Orthopedic Surgery

## 2023-01-04 ENCOUNTER — Encounter
Admission: RE | Disposition: A | Discharge status: Home / Self Care | Payer: Self-pay | Source: Home / Self Care | Attending: Orthopedic Surgery

## 2023-01-04 DIAGNOSIS — Z96651 Presence of right artificial knee joint: Secondary | ICD-10-CM

## 2023-01-04 DIAGNOSIS — Z7984 Long term (current) use of oral hypoglycemic drugs: Secondary | ICD-10-CM | POA: Insufficient documentation

## 2023-01-04 DIAGNOSIS — E039 Hypothyroidism, unspecified: Secondary | ICD-10-CM

## 2023-01-04 DIAGNOSIS — Z7982 Long term (current) use of aspirin: Secondary | ICD-10-CM | POA: Diagnosis not present

## 2023-01-04 DIAGNOSIS — E119 Type 2 diabetes mellitus without complications: Secondary | ICD-10-CM

## 2023-01-04 DIAGNOSIS — I10 Essential (primary) hypertension: Secondary | ICD-10-CM | POA: Insufficient documentation

## 2023-01-04 DIAGNOSIS — Z01818 Encounter for other preprocedural examination: Secondary | ICD-10-CM

## 2023-01-04 DIAGNOSIS — Z79899 Other long term (current) drug therapy: Secondary | ICD-10-CM | POA: Diagnosis not present

## 2023-01-04 DIAGNOSIS — K219 Gastro-esophageal reflux disease without esophagitis: Secondary | ICD-10-CM

## 2023-01-04 DIAGNOSIS — M1711 Unilateral primary osteoarthritis, right knee: Secondary | ICD-10-CM | POA: Diagnosis present

## 2023-01-04 DIAGNOSIS — R8281 Pyuria: Secondary | ICD-10-CM

## 2023-01-04 DIAGNOSIS — R8290 Unspecified abnormal findings in urine: Secondary | ICD-10-CM

## 2023-01-04 HISTORY — DX: Other intervertebral disc degeneration, lumbosacral region without mention of lumbar back pain or lower extremity pain: M51.379

## 2023-01-04 HISTORY — DX: Chronic kidney disease, stage 3 unspecified: N18.30

## 2023-01-04 HISTORY — PX: KNEE ARTHROPLASTY: SHX992

## 2023-01-04 HISTORY — DX: Long term (current) use of aspirin: Z79.82

## 2023-01-04 HISTORY — DX: Unspecified right bundle-branch block: I45.10

## 2023-01-04 HISTORY — DX: Other ill-defined heart diseases: I51.89

## 2023-01-04 LAB — GLUCOSE, CAPILLARY
Glucose-Capillary: 105 mg/dL — ABNORMAL HIGH (ref 70–99)
Glucose-Capillary: 171 mg/dL — ABNORMAL HIGH (ref 70–99)
Glucose-Capillary: 219 mg/dL — ABNORMAL HIGH (ref 70–99)

## 2023-01-04 SURGERY — ARTHROPLASTY, KNEE, TOTAL, USING IMAGELESS COMPUTER-ASSISTED NAVIGATION
Anesthesia: General | Site: Knee | Laterality: Right

## 2023-01-04 MED ORDER — ONDANSETRON HCL 4 MG/2ML IJ SOLN
INTRAMUSCULAR | Status: AC
  Filled 2023-01-04: qty 2

## 2023-01-04 MED ORDER — AMLODIPINE BESYLATE 5 MG PO TABS: 10.0000 mg | ORAL_TABLET | ORAL | Status: DC

## 2023-01-04 MED ORDER — MIDAZOLAM HCL 5 MG/5ML IJ SOLN
INTRAMUSCULAR | Status: DC | PRN
  Administered 2023-01-04: 2 mg via INTRAVENOUS

## 2023-01-04 MED ORDER — METOCLOPRAMIDE HCL 10 MG PO TABS
10.0000 mg | ORAL_TABLET | Freq: Three times a day (TID) | ORAL | Status: DC
  Administered 2023-01-04 – 2023-01-05 (×3): 10 mg via ORAL

## 2023-01-04 MED ORDER — ROSUVASTATIN CALCIUM 20 MG PO TABS
40.0000 mg | ORAL_TABLET | Freq: Every day | ORAL | Status: DC
  Administered 2023-01-04: 40 mg via ORAL

## 2023-01-04 MED ORDER — PHENOL 1.4 % MT LIQD: 1.0000 | OROMUCOSAL | Status: DC | PRN

## 2023-01-04 MED ORDER — INSULIN ASPART 100 UNIT/ML IJ SOLN
0.0000 [IU] | Freq: Every day | INTRAMUSCULAR | Status: DC
  Administered 2023-01-04: 2 [IU] via SUBCUTANEOUS

## 2023-01-04 MED ORDER — METOCLOPRAMIDE HCL 10 MG PO TABS
ORAL_TABLET | ORAL | Status: AC
Start: 2023-01-04 — End: ?
  Filled 2023-01-04: qty 1

## 2023-01-04 MED ORDER — METFORMIN HCL ER 500 MG PO TB24
ORAL_TABLET | ORAL | Status: AC
  Filled 2023-01-04: qty 2

## 2023-01-04 MED ORDER — PROPOFOL 1000 MG/100ML IV EMUL
INTRAVENOUS | Status: AC
  Filled 2023-01-04: qty 100

## 2023-01-04 MED ORDER — ENALAPRIL MALEATE 10 MG PO TABS
20.0000 mg | ORAL_TABLET | Freq: Two times a day (BID) | ORAL | Status: DC
  Filled 2023-01-04: qty 2
  Filled 2023-01-04: qty 1
  Filled 2023-01-04: qty 2

## 2023-01-04 MED ORDER — OXYCODONE HCL 5 MG PO TABS
10.0000 mg | ORAL_TABLET | ORAL | Status: DC | PRN
Start: 2023-01-04 — End: 2023-01-05
  Administered 2023-01-04: 10 mg via ORAL

## 2023-01-04 MED ORDER — BUPIVACAINE HCL (PF) 0.25 % IJ SOLN
INTRAMUSCULAR | Status: AC
  Filled 2023-01-04: qty 60

## 2023-01-04 MED ORDER — TRANEXAMIC ACID-NACL 1000-0.7 MG/100ML-% IV SOLN
INTRAVENOUS | Status: AC
  Filled 2023-01-04: qty 100

## 2023-01-04 MED ORDER — CEFAZOLIN SODIUM-DEXTROSE 2-4 GM/100ML-% IV SOLN
2.0000 g | Freq: Four times a day (QID) | INTRAVENOUS | Status: AC
  Administered 2023-01-04 – 2023-01-05 (×2): 2 g via INTRAVENOUS

## 2023-01-04 MED ORDER — TRANEXAMIC ACID-NACL 1000-0.7 MG/100ML-% IV SOLN
1000.0000 mg | Freq: Once | INTRAVENOUS | Status: AC
Start: 2023-01-04 — End: 2023-01-04
  Administered 2023-01-04: 1000 mg via INTRAVENOUS

## 2023-01-04 MED ORDER — SODIUM CHLORIDE 0.9 % IR SOLN
Status: DC | PRN
  Administered 2023-01-04: 3000 mL

## 2023-01-04 MED ORDER — CEFAZOLIN SODIUM-DEXTROSE 2-4 GM/100ML-% IV SOLN
INTRAVENOUS | Status: AC
  Filled 2023-01-04: qty 100

## 2023-01-04 MED ORDER — TRANEXAMIC ACID-NACL 1000-0.7 MG/100ML-% IV SOLN
INTRAVENOUS | Status: AC
Start: 2023-01-04 — End: ?
  Filled 2023-01-04: qty 100

## 2023-01-04 MED ORDER — ACETAMINOPHEN 325 MG PO TABS: 325.0000 mg | ORAL_TABLET | Freq: Four times a day (QID) | ORAL | Status: DC | PRN

## 2023-01-04 MED ORDER — MIDAZOLAM HCL 2 MG/2ML IJ SOLN
INTRAMUSCULAR | Status: AC
  Filled 2023-01-04: qty 2

## 2023-01-04 MED ORDER — BISACODYL 10 MG RE SUPP: 10.0000 mg | Freq: Every day | RECTAL | Status: DC | PRN

## 2023-01-04 MED ORDER — PANTOPRAZOLE SODIUM 40 MG PO TBEC
DELAYED_RELEASE_TABLET | ORAL | Status: AC
  Filled 2023-01-04: qty 1

## 2023-01-04 MED ORDER — MENTHOL 3 MG MT LOZG
1.0000 | LOZENGE | OROMUCOSAL | Status: DC | PRN
  Filled 2023-01-04: qty 9

## 2023-01-04 MED ORDER — ONDANSETRON HCL 4 MG/2ML IJ SOLN
INTRAMUSCULAR | Status: DC | PRN
  Administered 2023-01-04: 4 mg via INTRAVENOUS

## 2023-01-04 MED ORDER — FERROUS SULFATE 325 (65 FE) MG PO TABS
325.0000 mg | ORAL_TABLET | Freq: Two times a day (BID) | ORAL | Status: DC
  Administered 2023-01-04 – 2023-01-05 (×2): 325 mg via ORAL

## 2023-01-04 MED ORDER — FLEET ENEMA RE ENEM: 1.0000 | ENEMA | Freq: Once | RECTAL | Status: DC | PRN

## 2023-01-04 MED ORDER — ACETAMINOPHEN 10 MG/ML IV SOLN
INTRAVENOUS | Status: AC
  Filled 2023-01-04: qty 100

## 2023-01-04 MED ORDER — EZETIMIBE 10 MG PO TABS
10.0000 mg | ORAL_TABLET | Freq: Every day | ORAL | Status: DC
  Administered 2023-01-04: 10 mg via ORAL

## 2023-01-04 MED ORDER — SENNOSIDES-DOCUSATE SODIUM 8.6-50 MG PO TABS
1.0000 | ORAL_TABLET | Freq: Two times a day (BID) | ORAL | Status: DC
  Administered 2023-01-04 – 2023-01-05 (×2): 1 via ORAL

## 2023-01-04 MED ORDER — ASPIRIN 81 MG PO CHEW
81.0000 mg | CHEWABLE_TABLET | Freq: Two times a day (BID) | ORAL | Status: DC
  Administered 2023-01-04 – 2023-01-05 (×2): 81 mg via ORAL

## 2023-01-04 MED ORDER — PROPOFOL 10 MG/ML IV BOLUS
INTRAVENOUS | Status: DC | PRN
  Administered 2023-01-04: 100 ug/kg/min via INTRAVENOUS
  Administered 2023-01-04: 20 mg via INTRAVENOUS

## 2023-01-04 MED ORDER — MAGNESIUM HYDROXIDE 400 MG/5ML PO SUSP
30.0000 mL | Freq: Every day | ORAL | Status: DC
  Administered 2023-01-05: 30 mL via ORAL

## 2023-01-04 MED ORDER — DEXAMETHASONE SODIUM PHOSPHATE 10 MG/ML IJ SOLN
INTRAMUSCULAR | Status: AC
  Filled 2023-01-04: qty 1

## 2023-01-04 MED ORDER — INSULIN ASPART 100 UNIT/ML IJ SOLN
0.0000 [IU] | Freq: Three times a day (TID) | INTRAMUSCULAR | Status: DC
  Administered 2023-01-05: 3 [IU] via SUBCUTANEOUS

## 2023-01-04 MED ORDER — BUPIVACAINE HCL (PF) 0.5 % IJ SOLN
INTRAMUSCULAR | Status: AC
Start: 2023-01-04 — End: ?
  Filled 2023-01-04: qty 10

## 2023-01-04 MED ORDER — LIDOCAINE HCL (CARDIAC) PF 100 MG/5ML IV SOSY
PREFILLED_SYRINGE | INTRAVENOUS | Status: DC | PRN
  Administered 2023-01-04: 20 mg via INTRAVENOUS

## 2023-01-04 MED ORDER — SURGIPHOR WOUND IRRIGATION SYSTEM - OPTIME: TOPICAL | Status: DC | PRN

## 2023-01-04 MED ORDER — EPHEDRINE 5 MG/ML INJ
INTRAVENOUS | Status: AC
  Filled 2023-01-04: qty 5

## 2023-01-04 MED ORDER — METFORMIN HCL ER 500 MG PO TB24
1000.0000 mg | ORAL_TABLET | Freq: Every evening | ORAL | Status: DC
  Administered 2023-01-04: 1000 mg via ORAL

## 2023-01-04 MED ORDER — ACETAMINOPHEN 10 MG/ML IV SOLN
INTRAVENOUS | Status: AC
Start: 2023-01-04 — End: ?
  Filled 2023-01-04: qty 100

## 2023-01-04 MED ORDER — LIDOCAINE HCL (PF) 2 % IJ SOLN
INTRAMUSCULAR | Status: AC
  Filled 2023-01-04: qty 5

## 2023-01-04 MED ORDER — FERROUS SULFATE 325 (65 FE) MG PO TABS
ORAL_TABLET | ORAL | Status: AC
  Filled 2023-01-04: qty 1

## 2023-01-04 MED ORDER — SODIUM CHLORIDE (PF) 0.9 % IJ SOLN
INTRAMUSCULAR | Status: DC | PRN
  Administered 2023-01-04: 120 mL via INTRAMUSCULAR

## 2023-01-04 MED ORDER — FENTANYL CITRATE (PF) 100 MCG/2ML IJ SOLN: 25.0000 ug | INTRAMUSCULAR | Status: DC | PRN

## 2023-01-04 MED ORDER — ACETAMINOPHEN 10 MG/ML IV SOLN: 1000.0000 mg | Freq: Once | INTRAVENOUS | Status: DC | PRN

## 2023-01-04 MED ORDER — SODIUM CHLORIDE FLUSH 0.9 % IV SOLN
INTRAVENOUS | Status: AC
  Filled 2023-01-04: qty 30

## 2023-01-04 MED ORDER — CHLORHEXIDINE GLUCONATE 0.12 % MT SOLN
OROMUCOSAL | Status: AC
  Filled 2023-01-04: qty 15

## 2023-01-04 MED ORDER — ONDANSETRON HCL 4 MG/2ML IJ SOLN: 4.0000 mg | Freq: Once | INTRAMUSCULAR | Status: DC | PRN

## 2023-01-04 MED ORDER — ROSUVASTATIN CALCIUM 20 MG PO TABS
ORAL_TABLET | ORAL | Status: AC
Start: 2023-01-04 — End: ?
  Filled 2023-01-04: qty 2

## 2023-01-04 MED ORDER — DIPHENHYDRAMINE HCL 12.5 MG/5ML PO ELIX: 12.5000 mg | ORAL_SOLUTION | ORAL | Status: DC | PRN

## 2023-01-04 MED ORDER — ACETAMINOPHEN 10 MG/ML IV SOLN
INTRAVENOUS | Status: DC | PRN
  Administered 2023-01-04: 1000 mg via INTRAVENOUS

## 2023-01-04 MED ORDER — ASPIRIN 81 MG PO CHEW
CHEWABLE_TABLET | ORAL | Status: AC
  Filled 2023-01-04: qty 1

## 2023-01-04 MED ORDER — EPHEDRINE SULFATE-NACL 50-0.9 MG/10ML-% IV SOSY
PREFILLED_SYRINGE | INTRAVENOUS | Status: DC | PRN
  Administered 2023-01-04: 5 mg via INTRAVENOUS

## 2023-01-04 MED ORDER — TRAMADOL HCL 50 MG PO TABS
50.0000 mg | ORAL_TABLET | ORAL | Status: DC | PRN
  Administered 2023-01-04: 50 mg via ORAL

## 2023-01-04 MED ORDER — ONDANSETRON HCL 4 MG PO TABS: 4.0000 mg | ORAL_TABLET | Freq: Four times a day (QID) | ORAL | Status: DC | PRN

## 2023-01-04 MED ORDER — SENNOSIDES-DOCUSATE SODIUM 8.6-50 MG PO TABS
ORAL_TABLET | ORAL | Status: AC
  Filled 2023-01-04: qty 1

## 2023-01-04 MED ORDER — PHENYLEPHRINE HCL-NACL 20-0.9 MG/250ML-% IV SOLN
INTRAVENOUS | Status: AC
  Filled 2023-01-04: qty 250

## 2023-01-04 MED ORDER — HYDROMORPHONE HCL 1 MG/ML IJ SOLN: 0.5000 mg | INTRAMUSCULAR | Status: DC | PRN

## 2023-01-04 MED ORDER — LEVOTHYROXINE SODIUM 100 MCG PO TABS
100.0000 ug | ORAL_TABLET | ORAL | Status: DC
  Administered 2023-01-05: 100 ug via ORAL
  Filled 2023-01-04: qty 1

## 2023-01-04 MED ORDER — GABAPENTIN 300 MG PO CAPS
ORAL_CAPSULE | ORAL | Status: AC
  Filled 2023-01-04: qty 1

## 2023-01-04 MED ORDER — BUPIVACAINE HCL (PF) 0.5 % IJ SOLN
INTRAMUSCULAR | Status: DC | PRN
  Administered 2023-01-04: 2.4 mL via INTRATHECAL

## 2023-01-04 MED ORDER — HYDROCHLOROTHIAZIDE 25 MG PO TABS: 25.0000 mg | ORAL_TABLET | ORAL | Status: DC

## 2023-01-04 MED ORDER — PHENYLEPHRINE HCL-NACL 20-0.9 MG/250ML-% IV SOLN
INTRAVENOUS | Status: DC | PRN
  Administered 2023-01-04: 40 ug/min via INTRAVENOUS

## 2023-01-04 MED ORDER — ACETAMINOPHEN 10 MG/ML IV SOLN
1000.0000 mg | Freq: Four times a day (QID) | INTRAVENOUS | Status: DC
  Administered 2023-01-04 – 2023-01-05 (×3): 1000 mg via INTRAVENOUS

## 2023-01-04 MED ORDER — OXYCODONE HCL 5 MG/5ML PO SOLN: 5.0000 mg | Freq: Once | ORAL | Status: DC | PRN

## 2023-01-04 MED ORDER — ONDANSETRON HCL 4 MG/2ML IJ SOLN: 4.0000 mg | Freq: Four times a day (QID) | INTRAMUSCULAR | Status: DC | PRN

## 2023-01-04 MED ORDER — ALUM & MAG HYDROXIDE-SIMETH 200-200-20 MG/5ML PO SUSP: 30.0000 mL | ORAL | Status: DC | PRN

## 2023-01-04 MED ORDER — PANTOPRAZOLE SODIUM 40 MG PO TBEC
40.0000 mg | DELAYED_RELEASE_TABLET | Freq: Two times a day (BID) | ORAL | Status: DC
  Administered 2023-01-04 – 2023-01-05 (×2): 40 mg via ORAL

## 2023-01-04 MED ORDER — OXYCODONE HCL 5 MG PO TABS
5.0000 mg | ORAL_TABLET | ORAL | Status: DC | PRN
Start: 2023-01-04 — End: 2023-01-05
  Administered 2023-01-05 (×3): 5 mg via ORAL

## 2023-01-04 MED ORDER — LEVOTHYROXINE SODIUM 50 MCG PO TABS: 50.0000 ug | ORAL_TABLET | ORAL | Status: DC

## 2023-01-04 MED ORDER — TRAMADOL HCL 50 MG PO TABS
ORAL_TABLET | ORAL | Status: AC
  Filled 2023-01-04: qty 1

## 2023-01-04 MED ORDER — LACTATED RINGERS IV SOLN
INTRAVENOUS | Status: AC
Start: 2023-01-04 — End: 2023-01-04

## 2023-01-04 MED ORDER — OXYCODONE HCL 5 MG PO TABS
ORAL_TABLET | ORAL | Status: AC
  Filled 2023-01-04: qty 2

## 2023-01-04 MED ORDER — EZETIMIBE 10 MG PO TABS
ORAL_TABLET | ORAL | Status: AC
Start: 2023-01-04 — End: ?
  Filled 2023-01-04: qty 1

## 2023-01-04 MED ORDER — BUPIVACAINE LIPOSOME 1.3 % IJ SUSP
INTRAMUSCULAR | Status: AC
  Filled 2023-01-04: qty 20

## 2023-01-04 MED ORDER — INSULIN ASPART 100 UNIT/ML IJ SOLN
INTRAMUSCULAR | Status: AC
Start: 2023-01-04 — End: ?
  Filled 2023-01-04: qty 1

## 2023-01-04 MED ORDER — CELECOXIB 200 MG PO CAPS
ORAL_CAPSULE | ORAL | Status: AC
  Filled 2023-01-04: qty 2

## 2023-01-04 MED ORDER — FENOFIBRATE 54 MG PO TABS
54.0000 mg | ORAL_TABLET | Freq: Every day | ORAL | Status: DC
  Administered 2023-01-04: 54 mg via ORAL
  Filled 2023-01-04: qty 1

## 2023-01-04 MED ORDER — OXYCODONE HCL 5 MG PO TABS: 5.0000 mg | ORAL_TABLET | Freq: Once | ORAL | Status: DC | PRN

## 2023-01-04 MED ORDER — SODIUM CHLORIDE 0.9 % IV SOLN: INTRAVENOUS | Status: DC

## 2023-01-04 SURGICAL SUPPLY — 65 items
ATTUNE PS FEM RT SZ 7 CEM KNEE (Femur) IMPLANT
ATTUNE PSRP INSR SZ7 7 KNEE (Insert) IMPLANT
BASE TIBIA ATTUNE KNEE SYS SZ6 (Knees) IMPLANT
BATTERY INSTRU NAVIGATION (MISCELLANEOUS) ×4 IMPLANT
BIT DRILL QUICK REL 1/8 2PK SL (BIT) ×1 IMPLANT
BLADE CLIPPER SURG (BLADE) IMPLANT
BLADE SAW 70X12.5 (BLADE) ×1 IMPLANT
BLADE SAW 90X13X1.19 OSCILLAT (BLADE) ×1 IMPLANT
BLADE SAW 90X25X1.19 OSCILLAT (BLADE) ×1 IMPLANT
BONE CEMENT GENTAMICIN (Cement) ×2 IMPLANT
BRUSH SCRUB EZ PLAIN DRY (MISCELLANEOUS) ×1 IMPLANT
CEMENT BONE GENTAMICIN 40 (Cement) IMPLANT
COOLER POLAR GLACIER W/PUMP (MISCELLANEOUS) ×1 IMPLANT
CUFF TRNQT CYL 24X4X16.5-23 (TOURNIQUET CUFF) IMPLANT
CUFF TRNQT CYL 30X4X21-28X (TOURNIQUET CUFF) IMPLANT
DRSG AQUACEL AG ADV 3.5X14 (GAUZE/BANDAGES/DRESSINGS) ×1 IMPLANT
DRSG MEPILEX SACRM 8.7X9.8 (GAUZE/BANDAGES/DRESSINGS) ×1 IMPLANT
DRSG TEGADERM 4X4.75 (GAUZE/BANDAGES/DRESSINGS) ×1 IMPLANT
DURAPREP 26ML APPLICATOR (WOUND CARE) ×2 IMPLANT
ELECT CAUTERY BLADE 6.4 (BLADE) ×1 IMPLANT
ELECT REM PT RETURN 9FT ADLT (ELECTROSURGICAL) ×1
ELECTRODE REM PT RTRN 9FT ADLT (ELECTROSURGICAL) ×1 IMPLANT
EVACUATOR 1/8 PVC DRAIN (DRAIN) ×1 IMPLANT
EX-PIN ORTHOLOCK NAV 4X150 (PIN) ×2 IMPLANT
GAUZE XEROFORM 1X8 LF (GAUZE/BANDAGES/DRESSINGS) ×1 IMPLANT
GLOVE BIOGEL M STRL SZ7.5 (GLOVE) ×6 IMPLANT
GLOVE SURG UNDER POLY LF SZ8 (GLOVE) ×2 IMPLANT
GOWN STRL REUS W/ TWL LRG LVL3 (GOWN DISPOSABLE) ×1 IMPLANT
GOWN STRL REUS W/ TWL XL LVL3 (GOWN DISPOSABLE) ×1 IMPLANT
GOWN TOGA ZIPPER T7+ PEEL AWAY (MISCELLANEOUS) ×1 IMPLANT
HOLDER FOLEY CATH W/STRAP (MISCELLANEOUS) ×1 IMPLANT
HOOD PEEL AWAY T7 (MISCELLANEOUS) ×1 IMPLANT
IV NS IRRIG 3000ML ARTHROMATIC (IV SOLUTION) ×1 IMPLANT
KIT TURNOVER KIT A (KITS) ×1 IMPLANT
KNIFE SCULPS 14X20 (INSTRUMENTS) ×1 IMPLANT
MANIFOLD NEPTUNE II (INSTRUMENTS) ×2 IMPLANT
NDL SPNL 20GX3.5 QUINCKE YW (NEEDLE) ×2 IMPLANT
NEEDLE SPNL 20GX3.5 QUINCKE YW (NEEDLE) ×2 IMPLANT
PACK TOTAL KNEE (MISCELLANEOUS) ×1 IMPLANT
PAD ABD DERMACEA PRESS 5X9 (GAUZE/BANDAGES/DRESSINGS) ×2 IMPLANT
PAD ARMBOARD 7.5X6 YLW CONV (MISCELLANEOUS) ×3 IMPLANT
PAD WRAPON POLAR KNEE (MISCELLANEOUS) ×1 IMPLANT
PATELLA MEDIAL ATTUN 35MM KNEE (Knees) IMPLANT
PENCIL SMOKE EVACUATOR COATED (MISCELLANEOUS) ×1 IMPLANT
PIN DRILL FIX HALF THREAD (BIT) ×2 IMPLANT
PIN FIXATION 1/8DIA X 3INL (PIN) ×1 IMPLANT
PULSAVAC PLUS IRRIG FAN TIP (DISPOSABLE) ×1
SOLUTION IRRIG SURGIPHOR (IV SOLUTION) ×1 IMPLANT
SPONGE DRAIN TRACH 4X4 STRL 2S (GAUZE/BANDAGES/DRESSINGS) ×1 IMPLANT
STAPLER SKIN PROX 35W (STAPLE) ×1 IMPLANT
STOCKINETTE STRL BIAS CUT 8X4 (MISCELLANEOUS) ×1 IMPLANT
STRAP TIBIA SHORT (MISCELLANEOUS) ×1 IMPLANT
SUCTION TUBE FRAZIER 10FR DISP (SUCTIONS) ×1 IMPLANT
SUT VIC AB 0 CT1 36 (SUTURE) ×1 IMPLANT
SUT VIC AB 1 CT1 36 (SUTURE) ×2 IMPLANT
SUT VIC AB 2-0 CT2 27 (SUTURE) ×1 IMPLANT
SYR 30ML LL (SYRINGE) ×2 IMPLANT
TIBIA ATTUNE KNEE SYS BASE SZ6 (Knees) ×1 IMPLANT
TIP FAN IRRIG PULSAVAC PLUS (DISPOSABLE) ×1 IMPLANT
TOWEL OR 17X26 4PK STRL BLUE (TOWEL DISPOSABLE) ×1 IMPLANT
TOWER CARTRIDGE SMART MIX (DISPOSABLE) ×1 IMPLANT
TRAP FLUID SMOKE EVACUATOR (MISCELLANEOUS) ×1 IMPLANT
TRAY FOLEY MTR SLVR 16FR STAT (SET/KITS/TRAYS/PACK) ×1 IMPLANT
WATER STERILE IRR 1000ML POUR (IV SOLUTION) ×1 IMPLANT
WRAPON POLAR PAD KNEE (MISCELLANEOUS) ×1

## 2023-01-04 NOTE — Plan of Care (Signed)
  Problem: Pain Management: Goal: Pain level will decrease with appropriate interventions Outcome: Progressing   

## 2023-01-04 NOTE — Progress Notes (Signed)
Patient is not able to walk the distance required to go the bathroom, or he/she is unable to safely negotiate stairs required to access the bathroom.  A 3in1 BSC will alleviate this problem   Julie Torres, Jr. M.D.  

## 2023-01-04 NOTE — Transfer of Care (Signed)
Immediate Anesthesia Transfer of Care Note  Patient: Julie Torres  Procedure(s) Performed: COMPUTER ASSISTED TOTAL KNEE ARTHROPLASTY (Right: Knee)  Patient Location: PACU  Anesthesia Type:Spinal  Level of Consciousness: drowsy  Airway & Oxygen Therapy: Patient Spontanous Breathing and Patient connected to face mask oxygen  Post-op Assessment: Report given to RN and Post -op Vital signs reviewed and stable  Post vital signs: Reviewed and stable  Last Vitals:  Vitals Value Taken Time  BP 111/59 01/04/23 1555  Temp 35.8 1555  Pulse 94 01/04/23 1600  Resp 19 01/04/23 1600  SpO2 99 % 01/04/23 1600  Vitals shown include unfiled device data.  Last Pain:  Vitals:   01/04/23 1101  TempSrc: Temporal  PainSc: 0-No pain         Complications: No notable events documented.

## 2023-01-04 NOTE — Op Note (Signed)
OPERATIVE NOTE  DATE OF SURGERY:  01/04/2023  PATIENT NAME:  Daylyn Ugarte   DOB: Apr 17, 1945  MRN: [id number]  PRE-OPERATIVE DIAGNOSIS: Degenerative arthrosis of the right knee, primary  POST-OPERATIVE DIAGNOSIS:  Same  PROCEDURE:  Right total knee arthroplasty using computer-assisted navigation  SURGEON:  Jena Gauss. M.D.  ASSISTANT:  Gean Birchwood, PA-C (present and scrubbed throughout the case, critical for assistance with exposure, retraction, instrumentation, and closure)  ANESTHESIA: spinal  ESTIMATED BLOOD LOSS: 50 mL  FLUIDS REPLACED: 600 mL of crystalloid  TOURNIQUET TIME: 74 minutes  DRAINS: 2 medium Hemovac drains  SOFT TISSUE RELEASES: Anterior cruciate ligament, posterior cruciate ligament, deep medial collateral ligament, patellofemoral ligament  IMPLANTS UTILIZED: DePuy Attune size 7 posterior stabilized femoral component (cemented), size 6 rotating platform tibial component (cemented), 35 mm medialized dome patella (cemented), and a 7 mm stabilized rotating platform polyethylene insert.  INDICATIONS FOR SURGERY: Alaija Masoner is a 77 y.o. year old female with a long history of progressive knee pain. X-rays demonstrated severe degenerative changes in tricompartmental fashion. The patient had not seen any significant improvement despite conservative nonsurgical intervention. After discussion of the risks and benefits of surgical intervention, the patient expressed understanding of the risks benefits and agree with plans for total knee arthroplasty.   The risks, benefits, and alternatives were discussed at length including but not limited to the risks of infection, bleeding, nerve injury, stiffness, blood clots, the need for revision surgery, cardiopulmonary complications, among others, and they were willing to proceed.  PROCEDURE IN DETAIL: The patient was brought into the operating room and, after adequate spinal anesthesia was achieved, a tourniquet  was placed on the patient's upper thigh. The patient's knee and leg were cleaned and prepped with alcohol and DuraPrep and draped in the usual sterile fashion. A "timeout" was performed as per usual protocol. The lower extremity was exsanguinated using an Esmarch, and the tourniquet was inflated to 300 mmHg. An anterior longitudinal incision was made followed by a standard mid vastus approach. The deep fibers of the medial collateral ligament were elevated in a subperiosteal fashion off of the medial flare of the tibia so as to maintain a continuous soft tissue sleeve. The patella was subluxed laterally and the patellofemoral ligament was incised. Inspection of the knee demonstrated severe degenerative changes with full-thickness loss of articular cartilage. Osteophytes were debrided using a rongeur. Anterior and posterior cruciate ligaments were excised. Two 4.0 mm Schanz pins were inserted in the femur and into the tibia for attachment of the array of trackers used for computer-assisted navigation. Hip center was identified using a circumduction technique. Distal landmarks were mapped using the computer. The distal femur and proximal tibia were mapped using the computer. The distal femoral cutting guide was positioned using computer-assisted navigation so as to achieve a 5 distal valgus cut. The femur was sized and it was felt that a size 7 femoral component was appropriate. A size 7 femoral cutting guide was positioned and the anterior cut was performed and verified using the computer. This was followed by completion of the posterior and chamfer cuts. Femoral cutting guide for the central box was then positioned in the center box cut was performed.  Attention was then directed to the proximal tibia. Medial and lateral menisci were excised. The extramedullary tibial cutting guide was positioned using computer-assisted navigation so as to achieve a 0 varus-valgus alignment and 3 posterior slope. The cut was  performed and verified using the computer. The proximal tibia  was sized and it was felt that a size 6 tibial tray was appropriate. Tibial and femoral trials were inserted followed by insertion of a 7 mm polyethylene insert. This allowed for excellent mediolateral soft tissue balancing both in flexion and in full extension. Finally, the patella was cut and prepared so as to accommodate a 35 mm medialized dome patella. A patella trial was placed and the knee was placed through a range of motion with excellent patellar tracking appreciated. The femoral trial was removed after debridement of posterior osteophytes. The central post-hole for the tibial component was reamed followed by insertion of a keel punch. Tibial trials were then removed. Cut surfaces of bone were irrigated with copious amounts of normal saline using pulsatile lavage and then suctioned dry. Polymethylmethacrylate cement with gentamicin was prepared in the usual fashion using a vacuum mixer. Cement was applied to the cut surface of the proximal tibia as well as along the undersurface of a size 6 rotating platform tibial component. Tibial component was positioned and impacted into place. Excess cement was removed using Personal assistant. Cement was then applied to the cut surfaces of the femur as well as along the posterior flanges of the size 7 femoral component. The femoral component was positioned and impacted into place. Excess cement was removed using Personal assistant. A 7 mm polyethylene trial was inserted and the knee was brought into full extension with steady axial compression applied. Finally, cement was applied to the backside of a 35 mm medialized dome patella and the patellar component was positioned and patellar clamp applied. Excess cement was removed using Personal assistant. After adequate curing of the cement, the tourniquet was deflated after a total tourniquet time of 74 minutes. Hemostasis was achieved using electrocautery. The knee was  irrigated with copious amounts of normal saline using pulsatile lavage followed by 450 ml of Surgiphor and then suctioned dry. 20 mL of 1.3% Exparel and 60 mL of 0.25% Marcaine in 40 mL of normal saline was injected along the posterior capsule, medial and lateral gutters, and along the arthrotomy site. A 7 mm stabilized rotating platform polyethylene insert was inserted and the knee was placed through a range of motion with excellent mediolateral soft tissue balancing appreciated and excellent patellar tracking noted. 2 medium drains were placed in the wound bed and brought out through separate stab incisions. The medial parapatellar portion of the incision was reapproximated using interrupted sutures of #1 Vicryl. Subcutaneous tissue was approximated in layers using first #0 Vicryl followed #2-0 Vicryl. The skin was approximated with skin staples. A sterile dressing was applied.  The patient tolerated the procedure well and was transported to the recovery room in stable condition.    Cleave Ternes P. Angie Fava., M.D.

## 2023-01-04 NOTE — Progress Notes (Signed)
Subjective: Day of Surgery Procedure(s) (LRB): COMPUTER ASSISTED TOTAL KNEE ARTHROPLASTY (Right) Patient reports pain as mild.   Patient seen in rounds with Dr. Ernest Pine. Patient is well, and has had no acute complaints or problems. Denies any CP, SOB, N/V, fevers or chills   We will start therapy today.  Plan is to go Home after hospital stay.  Objective: Vital signs in last 24 hours: Temp:  [97.4 F (36.3 C)-98.8 F (37.1 C)] 97.5 F (36.4 C) (12/04 1645) Pulse Rate:  [69-91] 69 (12/04 1700) Resp:  [13-21] 13 (12/04 1700) BP: (94-152)/(52-75) 104/53 (12/04 1700) SpO2:  [97 %-100 %] 97 % (12/04 1700) Weight:  [79.4 kg] 79.4 kg (12/04 1101)  Intake/Output from previous day:  Intake/Output Summary (Last 24 hours) at 01/04/2023 1718 Last data filed at 01/04/2023 1645 Gross per 24 hour  Intake 900 ml  Output 260 ml  Net 640 ml    Intake/Output this shift: Total I/O In: 900 [I.V.:600; IV Piggyback:300] Out: 260 [Urine:200; Drains:10; Blood:50]  Labs: No results for input(s): "HGB" in the last 72 hours. No results for input(s): "WBC", "RBC", "HCT", "PLT" in the last 72 hours. No results for input(s): "NA", "K", "CL", "CO2", "BUN", "CREATININE", "GLUCOSE", "CALCIUM" in the last 72 hours. No results for input(s): "LABPT", "INR" in the last 72 hours.  EXAM General - Patient is Alert, Appropriate, and Oriented Extremity - Neurologically intact ABD soft Neurovascular intact Sensation intact distally Intact pulses distally Dorsiflexion/Plantar flexion intact No cellulitis present Compartment soft Dressing - dressing C/D/I and no drainage Motor Function - intact, moving foot and toes well on exam. Able to plantar and dorsi flex with good strength and ROM. Neurovascularly intact to all dermatomes down her right lower extremity. JP Drain pulled without difficulty. Intact  Past Medical History:  Diagnosis Date   Aortic valve stenosis    a.) TTE 07/30/2013: mild AS (MPG 12.8,  AVA 1.4 cm2); b.) TTE 12/20/2016: mild AS (MPG 14.9, AVA 1.0 cm2); c.) TTE 05/10/2018: mild AS (MPG 13.3, AVA 1.2 cm2); d.) TTE 08/04/2020: mild AS (MPG 11.4, AVA 0.74 cm2); e.) TTE 08/17/2021: mild-mod (MPG 20, AVA 0.75 cm2)   Bilateral carotid artery stenosis    CKD (chronic kidney disease), stage III (HCC)    DDD (degenerative disc disease), lumbosacral    Diastolic dysfunction    a.) TTE 07/31/2014: EF >55%, G1DD, triv AR/MR/PR, mild TR, mild AS; b.) TTE 12/20/2016: EF >55%, mild LVH, G1DD, triv AR/MR/PR, mild TR, mild AS; c.) TTE 05/10/2018: EF >55%, mild LVH, G1DD, triv AR/MR/PR, mild TR, mild AS; d.) TTE 08/04/2020: EF >55%, mod LVH, G1DD, triv MR, mild AR/TR/PR, mild AS; e.) TTE 08/17/2021: EF >55%, mild LVH, G1DD, mild LAC, triv MR, mild AR/TR/PR, mild AS   DM (diabetes mellitus), type 2 (HCC)    GERD (gastroesophageal reflux disease)    Heart murmur 09/17/2010   History of bilateral cataract extraction 2015   Hypertension    Hypothyroidism    Incomplete RBBB    Long-term use of aspirin therapy    Osteoarthritis of right knee    Pure hypercholesterolemia     Assessment/Plan: Day of Surgery Procedure(s) (LRB): COMPUTER ASSISTED TOTAL KNEE ARTHROPLASTY (Right) Principal Problem:   History of total knee arthroplasty, right  Estimated body mass index is 30.04 kg/m as calculated from the following:   Height as of this encounter: 5\' 4"  (1.626 m).   Weight as of this encounter: 79.4 kg. Advance diet Up with therapy  Patient will continue to  work with physical therapy to pass postoperative PT protocols, ROM and strengthening  Discussed with the patient continuing to utilize Polar Care  Patient will use bone foam in 20-30 minute intervals  Patient will wear TED hose bilaterally to help prevent DVT and clot formation  Discussed the Aquacel bandage.  This bandage will stay in place 7 days postoperatively.  Can be replaced with honeycomb bandages that will be sent home with the  patient  Discussed sending the patient home with tramadol and oxycodone for as needed pain management.  Patient will also be sent home with Celebrex to help with swelling and inflammation.  Patient will take an 81 mg aspirin twice daily for DVT prophylaxis  JP drain removed without difficulty, intact  Weight-Bearing as tolerated to right leg  Patient will follow-up with [facility]ics in 2 weeks for staple removal and reevaluation  Rayburn Go, PA-C [facility] 01/04/2023, 5:18 PM

## 2023-01-04 NOTE — Discharge Summary (Signed)
Physician Discharge Summary  Subjective: 1 Day Post-Op Procedure(s) (LRB): COMPUTER ASSISTED TOTAL KNEE ARTHROPLASTY (Right) Patient reports pain as mild.   Patient seen in rounds with Dr. Ernest Pine. Patient is well, and has had no acute complaints or problems. Denies any CP, SOB, N/V, fevers or chills. Patient is ready to go home  Physician Discharge Summary  Patient ID: Julie Torres MRN: [id number] DOB/AGE: 1945-04-25 77 y.o.  Admit date: 01/04/2023 Discharge date: 01/05/2023  Admission Diagnoses:  Discharge Diagnoses:  Principal Problem:   History of total knee arthroplasty, right   Discharged Condition: good  Hospital Course: Patient presented to the hospital on 01/04/2023 for an elective right TKA performed by Dr. Ernest Pine. Patient was given 1g of TXA and 2g of Ancef prior to the procedure. She tolerated the procedure well without any complications. See procedural note below for details. Postoperatively, the patient did very well. She was able to pass PT protocols on post-op day one without any issues. JP drain was removed without any difficulty and was intact. She was able to void her bladder without any difficulty. Physical exam was unremarkable. She denies any SOB, CP, N/V, fevers or chills. Vital signs are stable. Patient is stable to discharge home.   PROCEDURE:  Right total knee arthroplasty using computer-assisted navigation   SURGEON:  Jena Gauss. M.D.   ASSISTANT:  Gean Birchwood, PA-C (present and scrubbed throughout the case, critical for assistance with exposure, retraction, instrumentation, and closure)   ANESTHESIA: spinal   ESTIMATED BLOOD LOSS: 50 mL   FLUIDS REPLACED: 600 mL of crystalloid   TOURNIQUET TIME: 74 minutes   DRAINS: 2 medium Hemovac drains   SOFT TISSUE RELEASES: Anterior cruciate ligament, posterior cruciate ligament, deep medial collateral ligament, patellofemoral ligament   IMPLANTS UTILIZED: DePuy Attune size 7 posterior  stabilized femoral component (cemented), size 6 rotating platform tibial component (cemented), 35 mm medialized dome patella (cemented), and a 7 mm stabilized rotating platform polyethylene insert.  Treatments: none  Discharge Exam: Blood pressure (!) 106/52, pulse (!) 54, temperature 97.8 F (36.6 C), temperature source Oral, resp. rate 15, height 5\' 4"  (1.626 m), weight 79.4 kg, SpO2 100%.   Disposition: home    Allergies as of 01/05/2023   No Known Allergies      Medication List     TAKE these medications    amLODipine 10 MG tablet Commonly known as: NORVASC Take 10 mg by mouth every morning.   aspirin 81 MG tablet Take 1 tablet (81 mg total) by mouth 2 (two) times daily. What changed: when to take this   celecoxib 200 MG capsule Commonly known as: CeleBREX Take 1 capsule (200 mg total) by mouth 2 (two) times daily.   ciprofloxacin 500 MG tablet Commonly known as: CIPRO Take 500 mg by mouth 2 (two) times daily.   enalapril 20 MG tablet Commonly known as: VASOTEC Take 20 mg by mouth 2 (two) times daily.   ezetimibe 10 MG tablet Commonly known as: ZETIA Take 10 mg by mouth at bedtime.   fenofibrate 54 MG tablet Take 54 mg by mouth at bedtime.   hydrochlorothiazide 25 MG tablet Commonly known as: HYDRODIURIL Take 25 mg by mouth every morning.   levothyroxine 50 MCG tablet Commonly known as: SYNTHROID Take 50-100 mcg by mouth See admin instructions. Tues and Thurs 100 mcg, 50 mcg all other mornings   metFORMIN 500 MG 24 hr tablet Commonly known as: GLUCOPHAGE-XR Take 1,000 mg by mouth every evening.   multivitamin  tablet Take 1 tablet by mouth daily.   oxyCODONE 5 MG immediate release tablet Commonly known as: Oxy IR/ROXICODONE Take 1 tablet (5 mg total) by mouth every 4 (four) hours as needed for moderate pain (pain score 4-6) (pain score 4-6).   rosuvastatin 40 MG tablet Commonly known as: CRESTOR Take 40 mg by mouth at bedtime.   traMADol 50 MG  tablet Commonly known as: ULTRAM Take 1-2 tablets (50-100 mg total) by mouth every 4 (four) hours as needed for moderate pain (pain score 4-6).               Durable Medical Equipment  (From admission, onward)           Start     Ordered   01/04/23 1549  DME Walker rolling  Once       Question:  Patient needs a walker to treat with the following condition  Answer:  Total knee replacement status   01/04/23 1548   01/04/23 1549  DME Bedside commode  Once       Comments: Patient is not able to walk the distance required to go the bathroom, or he/she is unable to safely negotiate stairs required to access the bathroom.  A 3in1 BSC will alleviate this problem  Question:  Patient needs a bedside commode to treat with the following condition  Answer:  Total knee replacement status   01/04/23 1548            Follow-up Information     Dedra Skeens, PA-C Follow up on 01/18/2023.   Specialty: Orthopedic Surgery Why: at 10:45am Contact information: [address] [place] [address] [phone]         Donato Heinz, MD Follow up on 02/16/2023.   Specialty: Orthopedic Surgery Why: at 2:00pm Contact information: [address] [facility] [place] [address] [phone]                 Signed: Gean Birchwood 01/05/2023, 9:22 AM   Objective: Vital signs in last 24 hours: Temp:  [97.4 F (36.3 C)-98.8 F (37.1 C)] 97.8 F (36.6 C) (12/05 0758) Pulse Rate:  [54-91] 54 (12/05 0758) Resp:  [13-21] 15 (12/05 0758) BP: (92-152)/(46-75) 106/52 (12/05 0758) SpO2:  [96 %-100 %] 100 % (12/05 0758) Weight:  [79.4 kg] 79.4 kg (12/04 1101)  Intake/Output from previous day:  Intake/Output Summary (Last 24 hours) at 01/05/2023 0922 Last data filed at 01/05/2023 0551 Gross per 24 hour  Intake 1753.33 ml  Output 385 ml  Net 1368.33 ml    Intake/Output this shift: No intake/output data recorded.  Labs: No results  for input(s): "HGB" in the last 72 hours. No results for input(s): "WBC", "RBC", "HCT", "PLT" in the last 72 hours. No results for input(s): "NA", "K", "CL", "CO2", "BUN", "CREATININE", "GLUCOSE", "CALCIUM" in the last 72 hours. No results for input(s): "LABPT", "INR" in the last 72 hours.  EXAM: General - Patient is Alert, Appropriate, and Oriented Extremity - Neurologically intact ABD soft Neurovascular intact Sensation intact distally Intact pulses distally Dorsiflexion/Plantar flexion intact No cellulitis present Compartment soft Dressing - dressing C/D/I and no drainage Motor Function - intact, moving foot and toes well on exam. Able to plantar and dorsi flex with good strength and ROM. Neurovascularly intact to all dermatomes down her right lower extremity. JP Drain pulled without difficulty. Intact  Assessment/Plan: 1 Day Post-Op Procedure(s) (LRB): COMPUTER ASSISTED TOTAL KNEE ARTHROPLASTY (Right) Procedure(s) (LRB): COMPUTER ASSISTED  TOTAL KNEE ARTHROPLASTY (Right) Past Medical History:  Diagnosis Date   Aortic valve stenosis    a.) TTE 07/30/2013: mild AS (MPG 12.8, AVA 1.4 cm2); b.) TTE 12/20/2016: mild AS (MPG 14.9, AVA 1.0 cm2); c.) TTE 05/10/2018: mild AS (MPG 13.3, AVA 1.2 cm2); d.) TTE 08/04/2020: mild AS (MPG 11.4, AVA 0.74 cm2); e.) TTE 08/17/2021: mild-mod (MPG 20, AVA 0.75 cm2)   Bilateral carotid artery stenosis    CKD (chronic kidney disease), stage III (HCC)    DDD (degenerative disc disease), lumbosacral    Diastolic dysfunction    a.) TTE 07/31/2014: EF >55%, G1DD, triv AR/MR/PR, mild TR, mild AS; b.) TTE 12/20/2016: EF >55%, mild LVH, G1DD, triv AR/MR/PR, mild TR, mild AS; c.) TTE 05/10/2018: EF >55%, mild LVH, G1DD, triv AR/MR/PR, mild TR, mild AS; d.) TTE 08/04/2020: EF >55%, mod LVH, G1DD, triv MR, mild AR/TR/PR, mild AS; e.) TTE 08/17/2021: EF >55%, mild LVH, G1DD, mild LAC, triv MR, mild AR/TR/PR, mild AS   DM (diabetes mellitus), type 2 (HCC)    GERD  (gastroesophageal reflux disease)    Heart murmur 09/17/2010   History of bilateral cataract extraction 2015   Hypertension    Hypothyroidism    Incomplete RBBB    Long-term use of aspirin therapy    Osteoarthritis of right knee    Pure hypercholesterolemia    Principal Problem:   History of total knee arthroplasty, right  Estimated body mass index is 30.04 kg/m as calculated from the following:   Height as of this encounter: 5\' 4"  (1.626 m).   Weight as of this encounter: 79.4 kg.   Patient will continue to work with [facility] physical therapy ton gait, ROM and strengthening   Discussed with the patient continuing to utilize Polar Care   Patient will use bone foam in 20-30 minute intervals   Patient will wear TED hose bilaterally to help prevent DVT and clot formation   Discussed the Aquacel bandage.  This bandage will stay in place 7 days postoperatively.  Can be replaced with honeycomb bandages that will be sent home with the patient   Discussed sending the patient home with tramadol and oxycodone for as needed pain management.  Patient will also be sent home with Celebrex to help with swelling and inflammation.  Patient will take an 81 mg aspirin twice daily for DVT prophylaxis   JP drain removed without difficulty, intact   Weight-Bearing as tolerated to right leg   Patient will follow-up with [facility] in 2 weeks for staple removal and reevaluation  Diet - Diabetic diet Follow up - in 2 weeks Activity - WBAT Disposition - Home Condition Upon Discharge - Good DVT Prophylaxis - Aspirin and TED hose  Danise Edge, PA-C Orthopaedic Surgery 01/05/2023, 9:22 AM

## 2023-01-04 NOTE — Progress Notes (Signed)
PT Cancellation Note  Patient Details Name: Julie Torres MRN: [id number] DOB: 1945-07-16   Cancelled Treatment:    Reason Eval/Treat Not Completed: Medical issues which prohibited therapy In PACU ~1705, spoke with RN and pt, not yet ready for PT this date.  Will initiate PT POD1.    Malachi Pro, DPT 01/04/2023, 5:18 PM

## 2023-01-04 NOTE — Plan of Care (Signed)
Reviewed with patient, verbalizes understanding  

## 2023-01-04 NOTE — Anesthesia Procedure Notes (Signed)
Spinal  Patient location during procedure: OR Start time: 01/04/2023 12:19 PM End time: 01/04/2023 12:29 PM Reason for block: surgical anesthesia Staffing Performed: resident/CRNA  Resident/CRNA: Morene Crocker, CRNA Performed by: Morene Crocker, CRNA Authorized by: Reed Breech, MD   Preanesthetic Checklist Completed: patient identified, IV checked, site marked, risks and benefits discussed, surgical consent, monitors and equipment checked, pre-op evaluation and timeout performed Spinal Block Patient position: sitting Prep: ChloraPrep Patient monitoring: heart rate, continuous pulse ox and blood pressure Approach: midline Location: L4-5 Injection technique: single-shot Needle Needle type: Introducer and Pencan  Needle gauge: 24 G Needle length: 9 cm Assessment Events: CSF return Additional Notes Negative paresthesia. Negative blood return. Positive free-flowing CSF. Expiration date of kit checked and confirmed. Patient tolerated procedure well, without complications. Successful on first attempt. No complications noted. Pt. Tolerated procedure well without any c/o pain/discomfort.

## 2023-01-04 NOTE — Interval H&P Note (Signed)
History and Physical Interval Note:  01/04/2023 11:50 AM  Julie Torres  has presented today for surgery, with the diagnosis of PRIMARY OSTEOARTHRITIS OF RIGHT KNEE..  The various methods of treatment have been discussed with the patient and family. After consideration of risks, benefits and other options for treatment, the patient has consented to  Procedure(s): COMPUTER ASSISTED TOTAL KNEE ARTHROPLASTY (Right) as a surgical intervention.  The patient's history has been reviewed, patient examined, no change in status, stable for surgery.  I have reviewed the patient's chart and labs.  Questions were answered to the patient's satisfaction.     Einer Meals P Rebecah Dangerfield

## 2023-01-04 NOTE — Progress Notes (Signed)
Patient awake/alert x4. Able to move bilateral lower ext side to side, vague sensation. At this time no ready for PT. Denies pain at this time. Dinner and breakfast ordered. Family updated.

## 2023-01-04 NOTE — Anesthesia Preprocedure Evaluation (Addendum)
Anesthesia Evaluation  Patient identified by MRN, date of birth, ID band Patient awake    Reviewed: Allergy & Precautions, NPO status , Patient's Chart, lab work & pertinent test results  History of Anesthesia Complications Negative for: history of anesthetic complications  Airway Mallampati: III   Neck ROM: Full    Dental no notable dental hx.    Pulmonary neg pulmonary ROS   Pulmonary exam normal breath sounds clear to auscultation       Cardiovascular hypertension, + Peripheral Vascular Disease (carotid stenosis)  + Valvular Problems/Murmurs (mild AS)  Rhythm:Regular Rate:Normal + Systolic murmurs ECG 12/28/22:  Sinus bradycardia Minimal voltage criteria for LVH, may be normal variant ( R in aVL ) Borderline ECG When compared with ECG of 07-May-2018 18:12, No significant change was found  Echo 08/17/21:  NORMAL LEFT VENTRICULAR SYSTOLIC FUNCTION WITH MILD LVH NORMAL RIGHT VENTRICULAR SYSTOLIC FUNCTION MILD VALVULAR REGURGITATION  MILD VALVULAR STENOSIS  ESTIMATED LVEF >55% Aortic: MILD AI; MILD AS AS: MAX GRADIENT 3.33m/s; AVA: 0.75cm^2 (prev. exam 2.41m/s; AVA: 0.74cm^2) Mitral: TRIVIAL MR Tricuspid: MILD TR (2.58m/s; RVSP: ) Pulmonic: MILD PI LVH: MILD LVH Closest EF: >55% (Estimated) AVS: MILD AS Aortic: MILD AR Mitral: TRIVIAL MR Tricuspid: MILD TR    Neuro/Psych negative neurological ROS     GI/Hepatic ,GERD  ,,  Endo/Other  diabetes, Type 2Hypothyroidism  Obesity   Renal/GU Renal disease (stage III CKD)     Musculoskeletal  (+) Arthritis ,    Abdominal   Peds  Hematology negative hematology ROS (+)   Anesthesia Other Findings Reviewed and agree with Edd Fabian pre-anesthesia clinical review note.    Cardiology note 10/26/22:  77 y.o. female with  1. Aortic valve stenosis, mild  2. Bilateral carotid artery stenosis  3. Murmur, cardiac  4. Primary hypertension  5. Pure  hypercholesterolemia  6. Type 2 diabetes mellitus without complication, without long-term current use of insulin (CMS/HHS-HCC)   Plan  Aortic valve stenosis, stable, continue conservative management Bilateral carotid artery stenosis, continue current management, management per PCP Heart murmur, related to aortic valve stenosis, continue conservative management  Hypertension, well controlled, continue current medical therapy with amlodipine and enalapril Hyperlipidemia, continue Zetia and rosuvastatin for lipid management Mild obesity, recommend weight loss, exercise, and portion control Type 2 DM, on metformin, management per PCP Have patient follow up in 6 months    Reproductive/Obstetrics                             Anesthesia Physical Anesthesia Plan  ASA: 3  Anesthesia Plan: General and Spinal   Post-op Pain Management:    Induction: Intravenous  PONV Risk Score and Plan: 3 and Propofol infusion, TIVA, Treatment may vary due to age or medical condition and Ondansetron  Airway Management Planned: Natural Airway and Nasal Cannula  Additional Equipment:   Intra-op Plan:   Post-operative Plan:   Informed Consent: I have reviewed the patients History and Physical, chart, labs and discussed the procedure including the risks, benefits and alternatives for the proposed anesthesia with the patient or authorized representative who has indicated his/her understanding and acceptance.       Plan Discussed with: CRNA  Anesthesia Plan Comments: (Plan for spinal and GA with natural airway, LMA/GETA backup.  Patient consented for risks of anesthesia including but not limited to:  - adverse reactions to medications - damage to eyes, teeth, lips or other oral mucosa - nerve damage due to  positioning  - sore throat or hoarseness - headache, bleeding, infection, nerve damage 2/2 spinal - damage to heart, brain, nerves, lungs, other parts of body or loss of  life  Informed patient about role of CRNA in peri- and intra-operative care.  Patient voiced understanding.)        Anesthesia Quick Evaluation

## 2023-01-05 ENCOUNTER — Encounter: Payer: Self-pay | Admitting: Orthopedic Surgery

## 2023-01-05 DIAGNOSIS — M1711 Unilateral primary osteoarthritis, right knee: Secondary | ICD-10-CM | POA: Diagnosis not present

## 2023-01-05 LAB — GLUCOSE, CAPILLARY
Glucose-Capillary: 116 mg/dL — ABNORMAL HIGH (ref 70–99)
Glucose-Capillary: 166 mg/dL — ABNORMAL HIGH (ref 70–99)

## 2023-01-05 MED ORDER — SENNOSIDES-DOCUSATE SODIUM 8.6-50 MG PO TABS
ORAL_TABLET | ORAL | Status: AC
  Filled 2023-01-05: qty 1

## 2023-01-05 MED ORDER — PANTOPRAZOLE SODIUM 40 MG PO TBEC
DELAYED_RELEASE_TABLET | ORAL | Status: AC
Start: 2023-01-05 — End: ?
  Filled 2023-01-05: qty 1

## 2023-01-05 MED ORDER — ACETAMINOPHEN 10 MG/ML IV SOLN
INTRAVENOUS | Status: AC
  Filled 2023-01-05: qty 100

## 2023-01-05 MED ORDER — ASPIRIN 81 MG PO TABS: 81.0000 mg | ORAL_TABLET | Freq: Two times a day (BID) | ORAL | Status: AC

## 2023-01-05 MED ORDER — CEFAZOLIN SODIUM-DEXTROSE 2-4 GM/100ML-% IV SOLN
INTRAVENOUS | Status: AC
  Filled 2023-01-05: qty 100

## 2023-01-05 MED ORDER — MAGNESIUM HYDROXIDE 400 MG/5ML PO SUSP
ORAL | Status: AC
Start: 2023-01-05 — End: ?
  Filled 2023-01-05: qty 30

## 2023-01-05 MED ORDER — METOCLOPRAMIDE HCL 10 MG PO TABS
ORAL_TABLET | ORAL | Status: AC
  Filled 2023-01-05: qty 1

## 2023-01-05 MED ORDER — METOCLOPRAMIDE HCL 10 MG PO TABS
ORAL_TABLET | ORAL | Status: AC
Start: 2023-01-05 — End: ?
  Filled 2023-01-05: qty 1

## 2023-01-05 MED ORDER — HYDROCHLOROTHIAZIDE 25 MG PO TABS
ORAL_TABLET | ORAL | Status: AC
  Filled 2023-01-05: qty 1

## 2023-01-05 MED ORDER — ASPIRIN 81 MG PO CHEW
CHEWABLE_TABLET | ORAL | Status: AC
  Filled 2023-01-05: qty 1

## 2023-01-05 MED ORDER — OXYCODONE HCL 5 MG PO TABS
ORAL_TABLET | ORAL | Status: AC
  Filled 2023-01-05: qty 1

## 2023-01-05 MED ORDER — TRAMADOL HCL 50 MG PO TABS: 50.0000 mg | ORAL_TABLET | ORAL | 0 refills | Status: AC | PRN

## 2023-01-05 MED ORDER — FERROUS SULFATE 325 (65 FE) MG PO TABS
ORAL_TABLET | ORAL | Status: AC
  Filled 2023-01-05: qty 1

## 2023-01-05 MED ORDER — OXYCODONE HCL 5 MG PO TABS: 5.0000 mg | ORAL_TABLET | ORAL | 0 refills | Status: AC | PRN

## 2023-01-05 MED ORDER — CELECOXIB 200 MG PO CAPS: 200.0000 mg | ORAL_CAPSULE | Freq: Two times a day (BID) | ORAL | 1 refills | Status: AC

## 2023-01-05 MED ORDER — AMLODIPINE BESYLATE 5 MG PO TABS
ORAL_TABLET | ORAL | Status: AC
Start: 2023-01-05 — End: ?
  Filled 2023-01-05: qty 2

## 2023-01-05 MED ORDER — INSULIN ASPART 100 UNIT/ML IJ SOLN
INTRAMUSCULAR | Status: AC
  Filled 2023-01-05: qty 1

## 2023-01-05 NOTE — Plan of Care (Signed)
  Problem: Activity: Goal: Ability to avoid complications of mobility impairment will improve Outcome: Progressing Goal: Range of joint motion will improve Outcome: Progressing   Problem: Pain Management: Goal: Pain level will decrease with appropriate interventions Outcome: Progressing   

## 2023-01-05 NOTE — Evaluation (Addendum)
Occupational Therapy Evaluation Patient Details Name: Julie Torres MRN: [id number] DOB: [birth year] Today's Date: 01/05/2023   History of Present Illness Pt is a 77 yo woman s/p R TKA. PMH of HTN, PVD, DM, CKD, HLD.   Clinical Impression   Pt seen for OT evaluation this date, POD#1 from above surgery. Pt was independent in all ADLs prior to surgery. Pt is eager to return to PLOF with less pain and improved safety and independence. Pt currently requires minimal assist for LB dressing while in seated position due to pain and limited AROM of R knee. Dons underwear with minA, then progressing to CGA for donning pants. CGA/Supervision level for bed mobility, transfers and toileting hygiene. Pt instructed in polar care mgt, falls prevention strategies, home/routines modifications, DME/AE for LB bathing and dressing tasks, and compression stocking mgt. Pt has 24/7 support and assist from family at home. Verbalizes understanding of precautions and education with good recall. Do not currently anticipate any OT needs following this hospitalization.          If plan is discharge home, recommend the following: A little help with walking and/or transfers;A little help with bathing/dressing/bathroom;Help with stairs or ramp for entrance;Assistance with cooking/housework    Functional Status Assessment  Patient has had a recent decline in their functional status and demonstrates the ability to make significant improvements in function in a reasonable and predictable amount of time.  Equipment Recommendations  BSC/3in1    Recommendations for Other Services       Precautions / Restrictions Precautions Precautions: Fall;Knee Restrictions Weight Bearing Restrictions: Yes RLE Weight Bearing: Weight bearing as tolerated      Mobility Bed Mobility Overal bed mobility: Needs Assistance Bed Mobility: Supine to Sit     Supine to sit: Supervision          Transfers Overall transfer level:  Needs assistance   Transfers: Sit to/from Stand Sit to Stand: Contact guard assist, Supervision           General transfer comment: CGA for safety, progressing to supervision      Balance Overall balance assessment: Needs assistance Sitting-balance support: Feet supported Sitting balance-Leahy Scale: Normal     Standing balance support: During functional activity, Bilateral upper extremity supported Standing balance-Leahy Scale: Good                             ADL either performed or assessed with clinical judgement   ADL Overall ADL's : Needs assistance/impaired Eating/Feeding: Independent;Sitting   Grooming: Supervision/safety;Standing Grooming Details (indicate cue type and reason): washes hands standing at sink Upper Body Bathing: Set up;Sitting   Lower Body Bathing: Minimal assistance;Sit to/from stand;Sitting/lateral leans   Upper Body Dressing : Set up;Sitting   Lower Body Dressing: Minimal assistance;Sit to/from stand   Toilet Transfer: Supervision/safety;BSC/3in1;Rolling walker (2 wheels) Toilet Transfer Details (indicate cue type and reason): BSC over commode Toileting- Clothing Manipulation and Hygiene: Supervision/safety;Sit to/from stand       Functional mobility during ADLs: Rolling walker (2 wheels);Contact guard assist General ADL Comments: Pt performing UB/LB dressing, functional mobility to commode, toileting transfers and hygeine, and transfer to recliner with CGA/SUPERVISION     Vision Baseline Vision/History: 1 Wears glasses Ability to See in Adequate Light: 0 Adequate Patient Visual Report: No change from baseline              Pertinent Vitals/Pain Pain Assessment Pain Assessment: 0-10 Pain Score: 5  Pain Location: R knee  Pain Descriptors / Indicators: Discomfort, Dull     Extremity/Trunk Assessment Upper Extremity Assessment Upper Extremity Assessment: Overall WFL for tasks assessed   Lower Extremity  Assessment Lower Extremity Assessment:  (RLE s/p TKA)   Cervical / Trunk Assessment Cervical / Trunk Assessment: Normal   Communication Communication Communication: No apparent difficulties   Cognition Arousal: Alert Behavior During Therapy: WFL for tasks assessed/performed Overall Cognitive Status: Within Functional Limits for tasks assessed                                       General Comments  able to perform straight leg raise without difficulties            Home Living Family/patient expects to be discharged to:: Private residence Living Arrangements: Children Available Help at Discharge: Family;Available 24 hours/day Type of Home: House Home Access: Stairs to enter [employer] of Steps: 5 Entrance Stairs-Rails: Right;Left (far distance apart) Home Layout: One level     Bathroom Shower/Tub: Producer, television/film/video: Standard (uses BSC over commode) Bathroom Accessibility: Yes   Home Equipment: Cane - Programmer, applications (2 wheels);Grab bars - tub/shower;Grab bars - toilet;BSC/3in1;Shower seat          Prior Functioning/Environment Prior Level of Function : Independent/Modified Independent;Driving             Mobility Comments: independent ADLs Comments: independent        OT Problem List: Decreased range of motion;Impaired balance (sitting and/or standing);Decreased knowledge of use of DME or AE;Decreased strength       AM-PAC OT "6 Clicks" Daily Activity     Outcome Measure Help from another person eating meals?: None Help from another person taking care of personal grooming?: None Help from another person toileting, which includes using toliet, bedpan, or urinal?: A Little Help from another person bathing (including washing, rinsing, drying)?: A Little Help from another person to put on and taking off regular upper body clothing?: None Help from another person to put on and taking off regular lower body  clothing?: A Little 6 Click Score: 21   End of Session Equipment Utilized During Treatment: Rolling walker (2 wheels);Gait belt Nurse Communication: Mobility status;Other (comment) (ADL education complete)  Activity Tolerance: Patient tolerated treatment well Patient left: in chair;with call bell/phone within reach  OT Visit Diagnosis: Other abnormalities of gait and mobility (R26.89);Unsteadiness on feet (R26.81);Pain Pain - Right/Left: Right Pain - part of body: Knee                Time: 0454-0981 OT Time Calculation (min): 33 min Charges:  OT General Charges $OT Visit: 1 Visit OT Evaluation $OT Eval Low Complexity: 1 Low OT Treatments $Self Care/Home Management : 8-22 mins  Pearly Apachito L. Allenmichael Mcpartlin, OTR/L  01/05/23, 12:42 PM

## 2023-01-05 NOTE — Progress Notes (Signed)
DISCHARGE NOTE  Pt given discharge instructions and verbalized understanding. TED hose on both legs. Walker and 2 honeycomb dressing sent with pt. Pt wheeled to car by staff, daughter providing transportation.

## 2023-01-05 NOTE — Discharge Instructions (Signed)
Instructions after Total Knee Replacement   Julie P. Angie Fava., M.D.    Dept. of Orthopaedics & Sports Medicine [facility] [address] [place], [place]  [address]  Phone: [phone]   Fax: [phone]       [email or web address]       DIET: Drink plenty of non-alcoholic fluids. Resume your normal diet. Include foods high in fiber.  ACTIVITY:  You may use crutches or a walker with weight-bearing as tolerated, unless instructed otherwise. You may be weaned off of the walker or crutches by your Physical Therapist.  Do NOT place pillows under the knee. Anything placed under the knee could limit your ability to straighten the knee.   Continue doing gentle exercises. Exercising will reduce the pain and swelling, increase motion, and prevent muscle weakness.   Please continue to use the TED compression stockings for 6 weeks. You may remove the stockings at night, but should reapply them in the morning. Do not drive or operate any equipment until instructed.  WOUND CARE:  Continue to use the PolarCare or ice packs periodically to reduce pain and swelling. You may bathe or shower after the staples are removed at the first office visit following surgery.  MEDICATIONS: You may resume your regular medications. Please take the pain medication as prescribed on the medication. Do not take pain medication on an empty stomach. You have been given a prescription for a blood thinner (Lovenox or Coumadin). Please take the medication as instructed. (NOTE: After completing a 2 week course of Lovenox, take one Enteric-coated aspirin once a day. This along with elevation will help reduce the possibility of phlebitis in your operated leg.) Do not drive or drink alcoholic beverages when taking pain medications.  CALL THE OFFICE FOR: Temperature above 101 degrees Excessive bleeding or drainage on the dressing. Excessive swelling, coldness, or paleness of the toes. Persistent nausea and  vomiting.  FOLLOW-UP:  You should have an appointment to return to the office in 10-14 days after surgery. Arrangements have been made for continuation of Physical Therapy (either home therapy or outpatient therapy).

## 2023-01-05 NOTE — Plan of Care (Signed)
  Problem: Activity: Goal: Ability to avoid complications of mobility impairment will improve Outcome: Progressing   Problem: Pain Management: Goal: Pain level will decrease with appropriate interventions Outcome: Progressing   Problem: Skin Integrity: Goal: Will show signs of wound healing Outcome: Progressing   

## 2023-01-05 NOTE — Evaluation (Signed)
Physical Therapy Evaluation Patient Details Name: Julie Torres MRN: [id number] DOB: 08-05-1945 Today's Date: 01/05/2023  History of Present Illness  Pt is a 77 yo woman s/p R TKA. PMH of HTN, PVD, DM, CKD, HLD.  Clinical Impression  Pt alert, agreeable to PT, reported 6/10 R knee pain. Pt at baseline is independent, planning on family assist as needed. She was up in the recliner at start/end of session, able to transfer with RW and supervision, safe hand placement noted. She ambulated ~279ft with RW and supervision, with cues progressed to step through pattern. Stair navigation with CGA and rails, pt able to teach back safe technique. ROM -2 to 90 degrees and verbal/tactile cues for a few exercises.  Overall the patient demonstrated deficits (see "PT Problem List") that impede the patient's functional abilities, safety, and mobility and would benefit from skilled PT intervention.        If plan is discharge home, recommend the following: Assistance with cooking/housework;Assist for transportation;Help with stairs or ramp for entrance   Can travel by private vehicle        Equipment Recommendations Rolling walker (2 wheels)  Recommendations for Other Services       Functional Status Assessment Patient has had a recent decline in their functional status and demonstrates the ability to make significant improvements in function in a reasonable and predictable amount of time.     Precautions / Restrictions Precautions Precautions: Fall;Knee Restrictions Weight Bearing Restrictions: Yes RLE Weight Bearing: Weight bearing as tolerated      Mobility  Bed Mobility               General bed mobility comments: pt sitting in recliner start/end of session    Transfers Overall transfer level: Needs assistance Equipment used: Rolling walker (2 wheels) Transfers: Sit to/from Stand Sit to Stand: Supervision                Ambulation/Gait Ambulation/Gait assistance:  Supervision Gait Distance (Feet): 220 Feet Assistive device: Rolling walker (2 wheels)         General Gait Details: step to antalgic gait but with cues improved to step through.  Stairs Stairs: Yes Stairs assistance: Contact guard assist Stair Management: One rail Left, Step to pattern, Forwards Number of Stairs: 4 General stair comments: able to demonstrate and teach back safe technique  Wheelchair Mobility     Tilt Bed    Modified Rankin (Stroke Patients Only)       Balance Overall balance assessment: Needs assistance Sitting-balance support: Feet supported Sitting balance-Leahy Scale: Good       Standing balance-Leahy Scale: Fair                               Pertinent Vitals/Pain Pain Assessment Pain Assessment: 0-10 Pain Score: 6  Pain Location: R knee Pain Descriptors / Indicators: Discomfort, Dull, Sore Pain Intervention(s): Limited activity within patient's tolerance, Monitored during session, Repositioned, Premedicated before session, Ice applied    Home Living Family/patient expects to be discharged to:: Private residence Living Arrangements: Children Available Help at Discharge: Family;Available 24 hours/day Type of Home: House Home Access: Stairs to enter Entrance Stairs-Rails: Right;Left (far distance apart) Entrance Stairs-Number of Steps: 5   Home Layout: One level Home Equipment: Cane - Programmer, applications (2 wheels);Grab bars - tub/shower;Grab bars - toilet;BSC/3in1;Shower seat      Prior Function Prior Level of Function : Independent/Modified Independent;Driving  Mobility Comments: independent ADLs Comments: independent     Extremity/Trunk Assessment   Upper Extremity Assessment Upper Extremity Assessment: Overall WFL for tasks assessed    Lower Extremity Assessment Lower Extremity Assessment:  (RLE s/p TKA)    Cervical / Trunk Assessment Cervical / Trunk Assessment: Normal  Communication       Cognition Arousal: Alert Behavior During Therapy: WFL for tasks assessed/performed Overall Cognitive Status: Within Functional Limits for tasks assessed                                          General Comments      Exercises Total Joint Exercises Quad Sets: AROM, Both, 10 reps Short Arc Quad: AROM, Strengthening, Right, 10 reps Knee Flexion: AROM, Strengthening, Right, 10 reps Goniometric ROM: -2 to 90 degrees   Assessment/Plan    PT Assessment Patient needs continued PT services  PT Problem List Decreased strength;Pain;Decreased range of motion;Decreased activity tolerance;Decreased balance;Decreased mobility;Decreased knowledge of use of DME;Decreased knowledge of precautions       PT Treatment Interventions DME instruction;Neuromuscular re-education;Gait training;Stair training;Patient/family education;Functional mobility training;Therapeutic activities;Therapeutic exercise;Balance training    PT Goals (Current goals can be found in the Care Plan section)  Acute Rehab PT Goals Patient Stated Goal: to return to PLOF PT Goal Formulation: With patient Time For Goal Achievement: 01/19/23 Potential to Achieve Goals: Good    Frequency BID     Co-evaluation               AM-PAC PT "6 Clicks" Mobility  Outcome Measure Help needed turning from your back to your side while in a flat bed without using bedrails?: None Help needed moving from lying on your back to sitting on the side of a flat bed without using bedrails?: None Help needed moving to and from a bed to a chair (including a wheelchair)?: None Help needed standing up from a chair using your arms (e.g., wheelchair or bedside chair)?: None Help needed to walk in hospital room?: None Help needed climbing 3-5 steps with a railing? : A Little 6 Click Score: 23    End of Session Equipment Utilized During Treatment: Gait belt Activity Tolerance: Patient tolerated treatment well Patient left: in  chair;with family/visitor present;with call bell/phone within reach Nurse Communication: Mobility status PT Visit Diagnosis: Other abnormalities of gait and mobility (R26.89);Difficulty in walking, not elsewhere classified (R26.2);Muscle weakness (generalized) (M62.81);Pain Pain - Right/Left: Right Pain - part of body: Knee    Time: 1610-9604 PT Time Calculation (min) (ACUTE ONLY): 26 min   Charges:   PT Evaluation $PT Eval Low Complexity: 1 Low PT Treatments $Gait Training: 8-22 mins $Therapeutic Activity: 8-22 mins PT General Charges $$ ACUTE PT VISIT: 1 Visit         Olga Coaster PT, DPT 10:54 AM,01/05/23

## 2023-01-05 NOTE — Anesthesia Postprocedure Evaluation (Signed)
Anesthesia Post Note  Patient: Julie Torres  Procedure(s) Performed: COMPUTER ASSISTED TOTAL KNEE ARTHROPLASTY (Right: Knee)  Patient location during evaluation: Nursing Unit Anesthesia Type: Spinal Level of consciousness: awake and alert, oriented and patient cooperative Pain management: pain level controlled Vital Signs Assessment: post-procedure vital signs reviewed and stable Respiratory status: spontaneous breathing, nonlabored ventilation and respiratory function stable Cardiovascular status: blood pressure returned to baseline Postop Assessment: no headache, no backache, no apparent nausea or vomiting, able to ambulate and adequate PO intake Anesthetic complications: no   No notable events documented.   Last Vitals:  Vitals:   01/05/23 0607 01/05/23 0758  BP: (!) 106/49 (!) 106/52  Pulse:  (!) 54  Resp:  15  Temp:  36.6 C  SpO2:  100%    Last Pain:  Vitals:   01/05/23 0801  TempSrc:   PainSc: 5                  Reed Breech

## 2023-01-05 NOTE — TOC Initial Note (Signed)
Transition of Care [facility]) - Initial/Assessment Note    Patient Details  Name: Julie Torres MRN: [id number] Date of Birth: 1945-06-29  [facility] CM/SW Contact:    Marlowe Sax, RN Phone Number: 01/05/2023, 8:51 AM  Clinical Narrative:                 The patient is set up prior to surgery with [facility] services RW to be delivered to the bedside by [employer] prior to DC  Expected Discharge Plan: Home w Home Health Services Barriers to Discharge: No Barriers Identified   Patient Goals and CMS Choice            Expected Discharge Plan and Services   Discharge Planning Services: CM Consult   Living arrangements for the past 2 months: Single Family Home                 DME Arranged: Walker rolling DME Agency: [facility] Date DME Agency Contacted: 01/05/23 Time DME Agency Contacted: [phone] Representative spoke with at DME Agency: [facility] [place] HH Arranged: PT, OT HH Agency: [facility] Date [facility] Agency Contacted: 01/05/23 Time HH Agency Contacted: [phone] Representative spoke with at [facility] Agency: [place]  Prior Living Arrangements/Services Living arrangements for the past 2 months: Single Family Home   Patient language and need for interpreter reviewed:: Yes Do you feel safe going back to the place where you live?: Yes      Need for Family Participation in Patient Care: Yes (Comment) Care giver support system in place?: Yes (comment) Current home services: DME Criminal Activity/Legal Involvement Pertinent to Current Situation/Hospitalization: No - Comment as needed  Activities of Daily Living   ADL Screening (condition at time of admission) Independently performs ADLs?: Yes (appropriate for developmental age) Is the patient deaf or have difficulty hearing?: No Does the patient have difficulty seeing, even when wearing glasses/contacts?: No Does the patient have difficulty concentrating, remembering, or making decisions?: No  Permission  Sought/Granted   Permission granted to share information with : Yes, Verbal Permission Granted              Emotional Assessment Appearance:: Appears stated age Attitude/Demeanor/Rapport: Engaged Affect (typically observed): Accepting Orientation: : Oriented to Self, Oriented to Place, Oriented to  Time, Oriented to Situation Alcohol / Substance Use: Not Applicable Psych Involvement: No (comment)  Admission diagnosis:  History of total knee arthroplasty, right [Z96.651] Patient Active Problem List   Diagnosis Date Noted   Diabetes mellitus type 2, uncomplicated (HCC) 01/04/2023   History of total knee arthroplasty, right 01/04/2023   Obesity 08/07/2020   Contusion of right shoulder 03/05/2020   Fracture of distal end of right humerus 03/05/2020   Separation of muscle (nontraumatic), right shoulder 03/05/2020   Osteopenia of left forearm 01/02/2018   Aortic valve stenosis, mild 12/26/2016   Bilateral carotid artery stenosis 10/11/2013   Unilateral primary osteoarthritis, right knee 03/16/2011   Hypertension 09/17/2010   Hypothyroidism, unspecified 09/17/2010   Pure hypercholesterolemia 09/17/2010   PCP:  Leim Fabry, MD Pharmacy:   [facility] [phone] [place], [place] - [address] AT [facility] [address] [place] [address] Phone: [phone] Fax: [phone]     Social Determinants of Health (SDOH) Social History: SDOH Screenings   Food Insecurity: No Food Insecurity (01/04/2023)  Housing: Low Risk  (01/04/2023)  Transportation Needs: No Transportation Needs (01/04/2023)  Utilities: Not At Risk (01/04/2023)  Financial Resource Strain:  Low Risk  (04/25/2022)   Received from [facility]  Tobacco Use: Low Risk  (01/04/2023)   SDOH Interventions:     Readmission Risk Interventions     No data to display

## 2023-01-19 ENCOUNTER — Other Ambulatory Visit: Payer: Self-pay | Admitting: Family Medicine

## 2023-01-19 DIAGNOSIS — Z1231 Encounter for screening mammogram for malignant neoplasm of breast: Secondary | ICD-10-CM

## 2023-02-09 ENCOUNTER — Ambulatory Visit
Admission: RE | Admit: 2023-02-09 | Discharge: 2023-02-09 | Disposition: A | Discharge status: Home / Self Care | Payer: Medicare Other | Source: Ambulatory Visit | Attending: Family Medicine | Admitting: Family Medicine

## 2023-02-09 DIAGNOSIS — Z1231 Encounter for screening mammogram for malignant neoplasm of breast: Secondary | ICD-10-CM | POA: Diagnosis present

## 2023-05-03 ENCOUNTER — Other Ambulatory Visit: Payer: Self-pay | Admitting: Family Medicine

## 2023-05-03 DIAGNOSIS — Z780 Asymptomatic menopausal state: Secondary | ICD-10-CM

## 2023-05-03 DIAGNOSIS — Z0000 Encounter for general adult medical examination without abnormal findings: Secondary | ICD-10-CM

## 2024-01-09 ENCOUNTER — Other Ambulatory Visit: Payer: Self-pay | Admitting: Family Medicine

## 2024-01-09 DIAGNOSIS — Z1231 Encounter for screening mammogram for malignant neoplasm of breast: Secondary | ICD-10-CM

## 2024-02-14 ENCOUNTER — Ambulatory Visit
Admission: RE | Admit: 2024-02-14 | Discharge: 2024-02-14 | Disposition: A | Discharge status: Home / Self Care | Source: Ambulatory Visit | Attending: Family Medicine | Admitting: Family Medicine

## 2024-02-14 DIAGNOSIS — Z1231 Encounter for screening mammogram for malignant neoplasm of breast: Secondary | ICD-10-CM | POA: Insufficient documentation
